# Patient Record
Sex: Female | Born: 1960 | Race: Black or African American | Hispanic: No | Marital: Married | Smoking: Current every day smoker
Health system: Southern US, Community
[De-identification: ages and names within clinical notes are randomized; demographics above are authoritative.]

## PROBLEM LIST (undated history)

## (undated) DIAGNOSIS — G629 Polyneuropathy, unspecified: Secondary | ICD-10-CM

## (undated) DIAGNOSIS — E785 Hyperlipidemia, unspecified: Secondary | ICD-10-CM

## (undated) DIAGNOSIS — I10 Essential (primary) hypertension: Secondary | ICD-10-CM

---

## 1999-11-27 ENCOUNTER — Emergency Department (HOSPITAL_COMMUNITY): Admission: EM | Admit: 1999-11-27 | Discharge: 1999-11-27 | Payer: Self-pay

## 2004-02-28 ENCOUNTER — Emergency Department (HOSPITAL_COMMUNITY): Admission: EM | Admit: 2004-02-28 | Discharge: 2004-02-28 | Payer: Self-pay | Admitting: Emergency Medicine

## 2004-03-03 ENCOUNTER — Encounter: Admission: RE | Admit: 2004-03-03 | Discharge: 2004-03-03 | Payer: Self-pay | Admitting: Specialist

## 2005-10-19 ENCOUNTER — Emergency Department (HOSPITAL_COMMUNITY): Admission: EM | Admit: 2005-10-19 | Discharge: 2005-10-19 | Payer: Self-pay | Admitting: Emergency Medicine

## 2007-02-19 ENCOUNTER — Emergency Department (HOSPITAL_COMMUNITY): Admission: EM | Admit: 2007-02-19 | Discharge: 2007-02-19 | Payer: Self-pay | Admitting: Emergency Medicine

## 2007-02-20 ENCOUNTER — Emergency Department (HOSPITAL_COMMUNITY): Admission: EM | Admit: 2007-02-20 | Discharge: 2007-02-20 | Payer: Self-pay | Admitting: Emergency Medicine

## 2007-08-06 ENCOUNTER — Emergency Department (HOSPITAL_COMMUNITY): Admission: EM | Admit: 2007-08-06 | Discharge: 2007-08-07 | Payer: Self-pay | Admitting: Emergency Medicine

## 2008-08-28 ENCOUNTER — Ambulatory Visit (HOSPITAL_COMMUNITY): Admission: RE | Admit: 2008-08-28 | Discharge: 2008-08-28 | Payer: Self-pay | Admitting: Orthopedic Surgery

## 2008-09-01 ENCOUNTER — Ambulatory Visit: Payer: Self-pay | Admitting: Cardiology

## 2008-09-01 ENCOUNTER — Telehealth (INDEPENDENT_AMBULATORY_CARE_PROVIDER_SITE_OTHER): Payer: Self-pay | Admitting: Radiology

## 2008-09-01 DIAGNOSIS — I10 Essential (primary) hypertension: Secondary | ICD-10-CM | POA: Insufficient documentation

## 2008-09-01 DIAGNOSIS — F17200 Nicotine dependence, unspecified, uncomplicated: Secondary | ICD-10-CM

## 2008-09-01 DIAGNOSIS — R9431 Abnormal electrocardiogram [ECG] [EKG]: Secondary | ICD-10-CM

## 2008-09-02 ENCOUNTER — Encounter: Payer: Self-pay | Admitting: Cardiology

## 2008-11-18 ENCOUNTER — Encounter (INDEPENDENT_AMBULATORY_CARE_PROVIDER_SITE_OTHER): Payer: Self-pay | Admitting: Orthopedic Surgery

## 2008-11-18 ENCOUNTER — Inpatient Hospital Stay (HOSPITAL_COMMUNITY): Admission: RE | Admit: 2008-11-18 | Discharge: 2008-11-23 | Payer: Self-pay | Admitting: Orthopedic Surgery

## 2009-01-06 ENCOUNTER — Inpatient Hospital Stay (HOSPITAL_COMMUNITY): Admission: RE | Admit: 2009-01-06 | Discharge: 2009-01-08 | Payer: Self-pay | Admitting: Orthopedic Surgery

## 2009-04-16 ENCOUNTER — Encounter (INDEPENDENT_AMBULATORY_CARE_PROVIDER_SITE_OTHER): Payer: Self-pay | Admitting: Orthopedic Surgery

## 2009-04-16 ENCOUNTER — Ambulatory Visit: Admission: RE | Admit: 2009-04-16 | Discharge: 2009-04-16 | Payer: Self-pay | Admitting: Orthopedic Surgery

## 2009-04-16 ENCOUNTER — Ambulatory Visit: Payer: Self-pay | Admitting: Vascular Surgery

## 2009-08-10 ENCOUNTER — Emergency Department (HOSPITAL_COMMUNITY): Admission: EM | Admit: 2009-08-10 | Discharge: 2009-08-11 | Payer: Self-pay | Admitting: Emergency Medicine

## 2009-08-14 ENCOUNTER — Emergency Department (HOSPITAL_COMMUNITY): Admission: EM | Admit: 2009-08-14 | Discharge: 2009-08-15 | Payer: Self-pay | Admitting: Emergency Medicine

## 2009-08-31 ENCOUNTER — Emergency Department (HOSPITAL_COMMUNITY): Admission: EM | Admit: 2009-08-31 | Discharge: 2009-08-31 | Payer: Self-pay | Admitting: Emergency Medicine

## 2009-11-18 ENCOUNTER — Emergency Department (HOSPITAL_COMMUNITY): Admission: EM | Admit: 2009-11-18 | Discharge: 2009-11-18 | Payer: Self-pay | Admitting: Emergency Medicine

## 2010-05-21 ENCOUNTER — Emergency Department (HOSPITAL_COMMUNITY)
Admission: EM | Admit: 2010-05-21 | Discharge: 2010-05-22 | Payer: Self-pay | Source: Home / Self Care | Admitting: Emergency Medicine

## 2010-05-22 ENCOUNTER — Encounter: Payer: Self-pay | Admitting: Internal Medicine

## 2010-07-19 LAB — GLUCOSE, CAPILLARY: Glucose-Capillary: 151 mg/dL — ABNORMAL HIGH (ref 70–99)

## 2010-07-19 LAB — DIFFERENTIAL
Basophils Absolute: 0.1 10*3/uL (ref 0.0–0.1)
Basophils Relative: 1 % (ref 0–1)
Eosinophils Absolute: 0 10*3/uL (ref 0.0–0.7)
Eosinophils Relative: 0 % (ref 0–5)
Lymphocytes Relative: 21 % (ref 12–46)
Lymphs Abs: 2 10*3/uL (ref 0.7–4.0)
Monocytes Absolute: 0.3 10*3/uL (ref 0.1–1.0)
Monocytes Relative: 3 % (ref 3–12)
Neutro Abs: 7 10*3/uL (ref 1.7–7.7)
Neutrophils Relative %: 75 % (ref 43–77)

## 2010-07-19 LAB — BASIC METABOLIC PANEL
BUN: 6 mg/dL (ref 6–23)
CO2: 18 mEq/L — ABNORMAL LOW (ref 19–32)
Calcium: 8.8 mg/dL (ref 8.4–10.5)
Chloride: 105 mEq/L (ref 96–112)
Creatinine, Ser: 0.51 mg/dL (ref 0.4–1.2)
GFR calc Af Amer: >60 mL/min (ref >60)
GFR calc non Af Amer: >60 mL/min (ref >60)
Glucose, Bld: 107 mg/dL — ABNORMAL HIGH (ref 70–99)
Potassium: 3.3 mEq/L — ABNORMAL LOW (ref 3.5–5.1)
Sodium: 138 mEq/L (ref 135–145)

## 2010-07-19 LAB — CBC
HCT: 37.5 % (ref 36.0–46.0)
Hemoglobin: 12.4 g/dL (ref 12.0–15.0)
MCHC: 33 g/dL (ref 30.0–36.0)
MCV: 88.1 fL (ref 78.0–100.0)
Platelets: 342 10*3/uL (ref 150–400)
RBC: 4.26 MIL/uL (ref 3.87–5.11)
RDW: 17.6 % — ABNORMAL HIGH (ref 11.5–15.5)
WBC: 9.3 10*3/uL (ref 4.0–10.5)

## 2010-08-05 LAB — COMPREHENSIVE METABOLIC PANEL
ALT: 15 U/L (ref 0–35)
AST: 29 U/L (ref 0–37)
Albumin: 3.7 g/dL (ref 3.5–5.2)
Alkaline Phosphatase: 71 U/L (ref 39–117)
BUN: 9 mg/dL (ref 6–23)
CO2: 23 mEq/L (ref 19–32)
Calcium: 8.8 mg/dL (ref 8.4–10.5)
Chloride: 103 mEq/L (ref 96–112)
Creatinine, Ser: 0.69 mg/dL (ref 0.4–1.2)
GFR calc Af Amer: >60 mL/min (ref >60)
GFR calc non Af Amer: >60 mL/min (ref >60)
Glucose, Bld: 151 mg/dL — ABNORMAL HIGH (ref 70–99)
Potassium: 3.6 mEq/L (ref 3.5–5.1)
Sodium: 136 mEq/L (ref 135–145)
Total Bilirubin: 0.4 mg/dL (ref 0.3–1.2)
Total Protein: 7 g/dL (ref 6.0–8.3)

## 2010-08-05 LAB — CBC
HCT: 34.5 % — ABNORMAL LOW (ref 36.0–46.0)
Hemoglobin: 11.4 g/dL — ABNORMAL LOW (ref 12.0–15.0)
MCHC: 32.9 g/dL (ref 30.0–36.0)
MCV: 88.4 fL (ref 78.0–100.0)
Platelets: 252 10*3/uL (ref 150–400)
RBC: 3.91 MIL/uL (ref 3.87–5.11)
RDW: 16.9 % — ABNORMAL HIGH (ref 11.5–15.5)
WBC: 7.5 10*3/uL (ref 4.0–10.5)

## 2010-08-05 LAB — PROTIME-INR
INR: 0.9 (ref 0.00–1.49)
Prothrombin Time: 12.2 seconds (ref 11.6–15.2)

## 2010-08-05 LAB — APTT: aPTT: 26 seconds (ref 24–37)

## 2010-08-07 LAB — URINE CULTURE: Colony Count: 9000

## 2010-08-07 LAB — URINE MICROSCOPIC-ADD ON

## 2010-08-07 LAB — DIFFERENTIAL
Basophils Absolute: 0 10*3/uL (ref 0.0–0.1)
Basophils Relative: 0 % (ref 0–1)
Eosinophils Absolute: 0.3 10*3/uL (ref 0.0–0.7)
Lymphs Abs: 2.8 10*3/uL (ref 0.7–4.0)
Neutrophils Relative %: 67 % (ref 43–77)

## 2010-08-07 LAB — TYPE AND SCREEN
ABO/RH(D): A POS
Antibody Screen: POSITIVE

## 2010-08-07 LAB — CBC
HCT: 25.1 % — ABNORMAL LOW (ref 36.0–46.0)
HCT: 25.9 % — ABNORMAL LOW (ref 36.0–46.0)
Hemoglobin: 13.3 g/dL (ref 12.0–15.0)
Hemoglobin: 8.5 g/dL — ABNORMAL LOW (ref 12.0–15.0)
Hemoglobin: 8.9 g/dL — ABNORMAL LOW (ref 12.0–15.0)
MCHC: 33.8 g/dL (ref 30.0–36.0)
MCHC: 34.3 g/dL (ref 30.0–36.0)
MCHC: 34.4 g/dL (ref 30.0–36.0)
MCV: 88.9 fL (ref 78.0–100.0)
MCV: 89.2 fL (ref 78.0–100.0)
Platelets: 200 10*3/uL (ref 150–400)
RBC: 2.92 MIL/uL — ABNORMAL LOW (ref 3.87–5.11)
RBC: 4.36 MIL/uL (ref 3.87–5.11)
RDW: 14.9 % (ref 11.5–15.5)
RDW: 15.8 % — ABNORMAL HIGH (ref 11.5–15.5)

## 2010-08-07 LAB — URINALYSIS, ROUTINE W REFLEX MICROSCOPIC
Glucose, UA: NEGATIVE mg/dL
Hgb urine dipstick: NEGATIVE
Ketones, ur: NEGATIVE mg/dL
Nitrite: NEGATIVE
Protein, ur: NEGATIVE mg/dL
Specific Gravity, Urine: 1.018 (ref 1.005–1.030)
Urobilinogen, UA: 0.2 mg/dL (ref 0.0–1.0)
pH: 5.5 (ref 5.0–8.0)

## 2010-08-07 LAB — BASIC METABOLIC PANEL
BUN: 15 mg/dL (ref 6–23)
CO2: 26 mEq/L (ref 19–32)
CO2: 27 mEq/L (ref 19–32)
Calcium: 7.9 mg/dL — ABNORMAL LOW (ref 8.4–10.5)
Calcium: 9.3 mg/dL (ref 8.4–10.5)
Chloride: 98 mEq/L (ref 96–112)
Creatinine, Ser: 1.03 mg/dL (ref 0.4–1.2)
GFR calc Af Amer: >60 mL/min (ref >60)
Glucose, Bld: 127 mg/dL — ABNORMAL HIGH (ref 70–99)
Potassium: 4.3 mEq/L (ref 3.5–5.1)

## 2010-08-07 LAB — COMPREHENSIVE METABOLIC PANEL
ALT: 16 U/L (ref 0–35)
CO2: 25 mEq/L (ref 19–32)
Calcium: 10.2 mg/dL (ref 8.4–10.5)
GFR calc non Af Amer: 51 mL/min — ABNORMAL LOW (ref >60)
Glucose, Bld: 132 mg/dL — ABNORMAL HIGH (ref 70–99)
Sodium: 136 mEq/L (ref 135–145)
Total Bilirubin: 0.7 mg/dL (ref 0.3–1.2)

## 2010-08-07 LAB — PROTIME-INR
INR: 1 (ref 0.00–1.49)
INR: 1 (ref 0.00–1.49)
INR: 1.9 — ABNORMAL HIGH (ref 0.00–1.49)
INR: 2.6 — ABNORMAL HIGH (ref 0.00–1.49)
INR: 2.8 — ABNORMAL HIGH (ref 0.00–1.49)
Prothrombin Time: 12.9 seconds (ref 11.6–15.2)
Prothrombin Time: 13.9 seconds (ref 11.6–15.2)
Prothrombin Time: 31.3 seconds — ABNORMAL HIGH (ref 11.6–15.2)

## 2010-08-10 LAB — BASIC METABOLIC PANEL
Calcium: 10 mg/dL (ref 8.4–10.5)
Chloride: 98 mEq/L (ref 96–112)
Creatinine, Ser: 0.81 mg/dL (ref 0.4–1.2)
GFR calc Af Amer: >60 mL/min (ref >60)

## 2010-08-10 LAB — DIFFERENTIAL
Lymphocytes Relative: 23 % (ref 12–46)
Lymphs Abs: 2.4 10*3/uL (ref 0.7–4.0)
Monocytes Relative: 3 % (ref 3–12)
Neutro Abs: 7.4 10*3/uL (ref 1.7–7.7)
Neutrophils Relative %: 71 % (ref 43–77)

## 2010-08-10 LAB — URINALYSIS, ROUTINE W REFLEX MICROSCOPIC
Glucose, UA: NEGATIVE mg/dL
Ketones, ur: NEGATIVE mg/dL
pH: 5.5 (ref 5.0–8.0)

## 2010-08-10 LAB — TYPE AND SCREEN: ABO/RH(D): A POS

## 2010-08-10 LAB — URINE MICROSCOPIC-ADD ON

## 2010-08-10 LAB — PROTIME-INR: INR: 0.9 (ref 0.00–1.49)

## 2010-08-10 LAB — URINE CULTURE

## 2010-08-10 LAB — CBC
RBC: 4.92 MIL/uL (ref 3.87–5.11)
WBC: 10.5 10*3/uL (ref 4.0–10.5)

## 2010-09-13 NOTE — Discharge Summary (Signed)
NAME:  Lynn Mayer, Lynn Mayer             ACCOUNT NO.:  [id number]   MEDICAL RECORD NO.:  [id number]          PATIENT TYPE:  INP   LOCATION:  5041                         FACILITY:  [facility]   PHYSICIAN:  Burnard Bunting, M.D.    DATE OF BIRTH:  1961/04/28   DATE OF ADMISSION:  11/18/2008  DATE OF DISCHARGE:  11/23/2008                               DISCHARGE SUMMARY   ADMISSION DIAGNOSES:  1. Left hip arthritis.  2. Hypertension.   DISCHARGE DIAGNOSES:  1. Left hip arthritis.  2. Hypertension.  3. Posthemorrhagic anemia.   PROCEDURE:  On November 18, 2008, the patient underwent left total hip  arthroplasty performed by Dr. August Saucer, assisted by Maud Deed, [facility],  under general anesthesia.   CONSULTATIONS:  None.   BRIEF HISTORY:  Auden is a 50 year old female with left hip arthritis.  She has failed conservative treatment and wished to proceed with  surgical intervention and was admitted for a left total hip replacement.   BRIEF HOSPITAL COURSE:  The patient tolerated the procedure under  general anesthesia without complications.  Postoperatively, pain was  controlled with PCA analgesics.  She was gradually weaned to p.o.  analgesics without difficulty.  Neurovascular motor function of the  lower extremities was intact throughout the hospital stay.  Dressing  changes were done daily and her wound was healing without erythema or  drainage.  She was started on physical therapy.  She was able to  ambulate greater than 400 feet while in the hospital.  Initially, she  had asked for nursing home placement as she did not have an adult  staying with her postoperatively.  She was able to progress quite well  and was independent prior to discharge and therefore did not need a  skilled nursing facility.  She was started on Coumadin for DVT  prophylaxis.  Adjustments in Coumadin dose were made according to daily  protimes by the pharmacist.  The patient was voiding without difficulty  after her Foley  catheter was discontinued.  She was afebrile and vital  signs were stable at the time of discharge.   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  Hemoglobin and hematocrit on admission 13.3 and 38.8 respectively.  Hemoglobin and hematocrit dropped to lowest value of 8.5 and 25.1.  Chemistry studies on admission within normal limits.  INR at discharge  2.6.  Chemistry studies normal with the exception of mild hyponatremia  at 133.  Hypocalcemia at 7.9.  Urinalysis on admission with moderate  leukocyte esterase, many epithelial cells, 21-50 wbc's per high-power  field, and many bacteria.  Urine culture showed insignificant growth.   PLAN:  The patient was discharged to her home.  Arrangements were made  through [facility] to provide home health physical therapy  and durable medical equipment.  The patient is instructed to keep her  incision dry and clean at all times.  She will be allowed to shower  after her return office visit with Dr. August Saucer in 1  week.  She is  instructed to continue ambulating as tolerated.  She will follow total  hip replacement precautions.  She will keep her incision dry and clean  and will change the dressing as needed.   DISCHARGE MEDICATIONS:  1. Norco for pain.  2. Robaxin for spasm.  3. Coumadin 5 mg 1 tablet daily for 4 weeks.   The patient will resume home medications of amlodipine and clonidine.  However, she will discontinue propoxyphene and Naprelan.  She will also  continue by Bystolic and Benicar.   The patient is instructed to call the office should she have questions  or concerns prior to return office visit.   CONDITION ON DISCHARGE:  Stable.      Wende Neighbors, P.A.      Burnard Bunting, M.D.  Electronically Signed    SMV/MEDQ  D:  12/17/2008  T:  12/18/2008  Job:   [id number]

## 2010-09-13 NOTE — Op Note (Signed)
NAME:  Lynn Mayer, Lynn Mayer             ACCOUNT NO.:  [id number]   MEDICAL RECORD NO.:  [id number]          PATIENT TYPE:  INP   LOCATION:  5041                         FACILITY:  [facility]   PHYSICIAN:  Burnard Bunting, M.D.    DATE OF BIRTH:  [birth year]   DATE OF PROCEDURE:  11/18/2008  DATE OF DISCHARGE:                               OPERATIVE REPORT   PREOPERATIVE DIAGNOSIS:  Left hip arthritis.   POSTOPERATIVE DIAGNOSIS:  Left hip arthritis.   PROCEDURE:  Left total hip replacement.   SURGEON:  Burnard Bunting, MD   ASSISTANT:  Wende Neighbors, PA   ANESTHESIA:  General.   INDICATIONS:  Lynn Mayer is a 50 year old female with left hip  arthritis who presents for total hip replacement after explanation of  risks and benefits of the procedure.   OPERATIVE FINDINGS:  1. Hip arthritis.  2. Rheumatoid-like synovitis with very proliferative synovitis within      the joint with a large cyst in the pericapsular area filled with      gelatinous cyst-like fluid.  The samples of the synovium were sent      for analysis to pathology.   ESTIMATED BLOOD LOSS:  300.   COMPONENTS:  DePuy acetabular cup, 52 mm pinnacle metal insert, 36 x 52  SROM proximal sleeve 11F small metal-on-metal femoral head +9, 36 with  11x 13 taper, SROM femoral stem, 36 standard neck +8 lateral 18 x 13.   PROCEDURE IN DETAIL:  The patient was brought to the operating room  where general endotracheal anesthesia was induced and preoperative  antibiotics were administered.  Left hip was prepped with DuraPrep  solution.  After that, the patient was placed in lateral decubitus  position with right axilla and right peroneal nerve well-padded.  Left  leg, hip, and foot were then prepped with DuraPrep solution and draped  in a sterile manner.  The Lynn Mayer was used to scrub the operative field.  Posterior approach to the hip was utilized.  Skin and subcutaneous  tissues were sharply divided.  Fascia lata was encountered  divided  sharply.  Gluteus maximus muscle was split in direction of its fibers.  Bleeding points were carefully controlled using electrocautery.  The  sciatic nerve was palpated and protected all times during the case.  The  patient had exuberant synovitis noted as well as cystic structure  filling the piriformis region.  This cyst was decompressed.  It was near  the sciatic nerve.  The cyst was decompressed and removed with care  taken to avoid injury to the sciatic nerve.  Synovitis was sent for  analysis.  This appeared to be like rheumatoid-type synovitis.  The  following removal of the cyst and removal of the synovitis, the femoral  neck was cut in perpendicular to mechanical axis in accordance with  preoperative templating and intraoperative templating.  The canal was  then reamed up to 13.5 mm with a good chatter and press-fit obtained.  At this time, the acetabulum was prepared.  The labrum was removed.  The  socket was reamed in approximately 45 degrees of abduction  and 10-15  degrees of anteversion.  The cup was then placed once good bleeding bone  was encountered.  The inner table was not penetrated.  At this time,  femur was then broached and sleeve was placed.  Trial stem was placed  first in 20 and then in 10 degrees of anteversion with a +0, +3, +6, and  +9 neck.  Radiographs made with the +3 demonstrated approximately equal  leg lengths good position of the cup and good fill of the stem in the  canal.  Trial components were removed from the femur.  The true  acetabular cup was placed and tapped in good position.  Metal liner was  placed.  The patient had the sleeve and stem was placed.  The patient  had slight anterior subluxation, which was corrected by going from +3 to  +9.  In full extension and external rotation, the patient's hip was  stable.  The position of sleeve as well as 90 degrees of hip flexion, 10  degrees of adduction, and 70 degrees of internal rotation.  At  this  time, the true +9 head was placed.  Incision was thoroughly irrigated.  The sciatic nerve was again palpated and found to be intact.  Capsule  was then closed.  It was split in T-shaped fashion and marked with  sutures earlier.  Following marking with the sutures, dislocation in the  case was performed as described.  After thorough irrigation, the capsule  was reapproximated with piriformis tendon suture to the capsule.  This  was done with #1 Vicryl suture.  Hemovac drain was placed.  Incision was  then closed using interrupted inverted #1 Vicryl suture to reapproximate  the fascia lata followed by 0 Vicryl suture, 2-0 Vicryl suture and skin  staples.  The patient tolerated the procedure well without immediate  complication.  Leg lengths were approximately equal at the conclusion of  the case.  The knee immobilizer was placed.  The patient tolerated the  procedure well without immediate complication.  Lynn Mayer's  assistance was required all times during the case for retraction  purposes as well as limb positioning purposes.  This was required both  for preparation of the femur and the acetabulum as well as for the  incision opening as well as the incision closing.  Her assistance was a  medical necessity.      Burnard Bunting, M.D.  Electronically Signed     GSD/MEDQ  D:  11/18/2008  T:  11/19/2008  Job:  [id number]

## 2011-07-17 ENCOUNTER — Other Ambulatory Visit: Payer: Self-pay

## 2011-07-17 ENCOUNTER — Encounter (HOSPITAL_COMMUNITY): Payer: Self-pay | Admitting: Anesthesiology

## 2011-07-17 ENCOUNTER — Encounter (HOSPITAL_COMMUNITY): Payer: Self-pay | Admitting: Emergency Medicine

## 2011-07-17 ENCOUNTER — Inpatient Hospital Stay (HOSPITAL_COMMUNITY)
Admission: EM | Admit: 2011-07-17 | Discharge: 2011-07-21 | DRG: 582 | Disposition: A | Discharge status: Home / Self Care | Payer: BC Managed Care – PPO | Source: Ambulatory Visit | Attending: Pulmonary Disease | Admitting: Pulmonary Disease

## 2011-07-17 ENCOUNTER — Emergency Department (HOSPITAL_COMMUNITY): Payer: BC Managed Care – PPO | Admitting: Anesthesiology

## 2011-07-17 ENCOUNTER — Emergency Department (HOSPITAL_COMMUNITY): Payer: BC Managed Care – PPO

## 2011-07-17 DIAGNOSIS — E119 Type 2 diabetes mellitus without complications: Secondary | ICD-10-CM | POA: Diagnosis present

## 2011-07-17 DIAGNOSIS — T46905A Adverse effect of unspecified agents primarily affecting the cardiovascular system, initial encounter: Secondary | ICD-10-CM | POA: Diagnosis present

## 2011-07-17 DIAGNOSIS — R9431 Abnormal electrocardiogram [ECG] [EKG]: Secondary | ICD-10-CM

## 2011-07-17 DIAGNOSIS — G609 Hereditary and idiopathic neuropathy, unspecified: Secondary | ICD-10-CM | POA: Diagnosis present

## 2011-07-17 DIAGNOSIS — T783XXA Angioneurotic edema, initial encounter: Principal | ICD-10-CM

## 2011-07-17 DIAGNOSIS — I10 Essential (primary) hypertension: Secondary | ICD-10-CM

## 2011-07-17 DIAGNOSIS — F17200 Nicotine dependence, unspecified, uncomplicated: Secondary | ICD-10-CM

## 2011-07-17 DIAGNOSIS — J9600 Acute respiratory failure, unspecified whether with hypoxia or hypercapnia: Secondary | ICD-10-CM

## 2011-07-17 DIAGNOSIS — E785 Hyperlipidemia, unspecified: Secondary | ICD-10-CM | POA: Diagnosis present

## 2011-07-17 HISTORY — DX: Essential (primary) hypertension: I10

## 2011-07-17 HISTORY — DX: Hyperlipidemia, unspecified: E78.5

## 2011-07-17 HISTORY — DX: Polyneuropathy, unspecified: G62.9

## 2011-07-17 LAB — COMPREHENSIVE METABOLIC PANEL
Albumin: 3.3 g/dL — ABNORMAL LOW (ref 3.5–5.2)
Alkaline Phosphatase: 91 U/L (ref 39–117)
BUN: 13 mg/dL (ref 6–23)
Calcium: 8.7 mg/dL (ref 8.4–10.5)
GFR calc Af Amer: >90 mL/min (ref >90)
Glucose, Bld: 196 mg/dL — ABNORMAL HIGH (ref 70–99)
Potassium: 3.5 mEq/L (ref 3.5–5.1)
Total Protein: 6.8 g/dL (ref 6.0–8.3)

## 2011-07-17 LAB — URINALYSIS, ROUTINE W REFLEX MICROSCOPIC
Bilirubin Urine: NEGATIVE
Glucose, UA: NEGATIVE mg/dL
Hgb urine dipstick: NEGATIVE
Protein, ur: 100 mg/dL — AB
Urobilinogen, UA: 0.2 mg/dL (ref 0.0–1.0)

## 2011-07-17 LAB — LACTIC ACID, PLASMA: Lactic Acid, Venous: 3.1 mmol/L — ABNORMAL HIGH (ref 0.5–2.2)

## 2011-07-17 LAB — CARDIAC PANEL(CRET KIN+CKTOT+MB+TROPI)
CK, MB: 2.4 ng/mL (ref 0.3–4.0)
CK, MB: 3.1 ng/mL (ref 0.3–4.0)
Total CK: 79 U/L (ref 7–177)
Troponin I: <0.3 ng/mL (ref <0.30)

## 2011-07-17 LAB — URINE MICROSCOPIC-ADD ON

## 2011-07-17 LAB — PHOSPHORUS: Phosphorus: 3.8 mg/dL (ref 2.3–4.6)

## 2011-07-17 LAB — POCT I-STAT 3, ART BLOOD GAS (G3+)
Bicarbonate: 19.5 mEq/L — ABNORMAL LOW (ref 20.0–24.0)
O2 Saturation: 100 %
TCO2: 21 mmol/L (ref 0–100)
pCO2 arterial: 35.5 mmHg (ref 35.0–45.0)
pH, Arterial: 7.334 — ABNORMAL LOW (ref 7.350–7.400)
pO2, Arterial: 424 mmHg — ABNORMAL HIGH (ref 80.0–100.0)

## 2011-07-17 LAB — PRO B NATRIURETIC PEPTIDE: Pro B Natriuretic peptide (BNP): 90.2 pg/mL (ref 0–125)

## 2011-07-17 LAB — GLUCOSE, CAPILLARY
Glucose-Capillary: 197 mg/dL — ABNORMAL HIGH (ref 70–99)
Glucose-Capillary: 164 mg/dL — ABNORMAL HIGH (ref 70–99)

## 2011-07-17 LAB — PROTIME-INR
INR: 0.93 (ref 0.00–1.49)
Prothrombin Time: 12.7 seconds (ref 11.6–15.2)

## 2011-07-17 LAB — MAGNESIUM: Magnesium: 1.3 mg/dL — ABNORMAL LOW (ref 1.5–2.5)

## 2011-07-17 LAB — CBC
Hemoglobin: 10.6 g/dL — ABNORMAL LOW (ref 12.0–15.0)
MCH: 27.6 pg (ref 26.0–34.0)
MCHC: 32.5 g/dL (ref 30.0–36.0)
RDW: 15.3 % (ref 11.5–15.5)

## 2011-07-17 MED ORDER — MIDAZOLAM HCL 2 MG/2ML IJ SOLN
2.0000 mg | INTRAMUSCULAR | Status: DC | PRN
  Administered 2011-07-17 – 2011-07-18 (×4): 2 mg via INTRAVENOUS

## 2011-07-17 MED ORDER — SUCCINYLCHOLINE CHLORIDE 20 MG/ML IJ SOLN
INTRAMUSCULAR | Status: AC
  Filled 2011-07-17: qty 10

## 2011-07-17 MED ORDER — ETOMIDATE 2 MG/ML IV SOLN
INTRAVENOUS | Status: AC
  Filled 2011-07-17: qty 20

## 2011-07-17 MED ORDER — DIPHENHYDRAMINE HCL 50 MG/ML IJ SOLN
INTRAMUSCULAR | Status: AC
  Administered 2011-07-17: 50 mg via INTRAVENOUS
  Filled 2011-07-17: qty 1

## 2011-07-17 MED ORDER — FAMOTIDINE IN NACL 20-0.9 MG/50ML-% IV SOLN
INTRAVENOUS | Status: AC
  Filled 2011-07-17: qty 50

## 2011-07-17 MED ORDER — FENTANYL BOLUS VIA INFUSION
50.0000 ug | Freq: Four times a day (QID) | INTRAVENOUS | Status: DC | PRN
  Filled 2011-07-17: qty 100

## 2011-07-17 MED ORDER — PANTOPRAZOLE SODIUM 40 MG IV SOLR: 40.0000 mg | Freq: Every day | INTRAVENOUS | Status: DC

## 2011-07-17 MED ORDER — MIDAZOLAM HCL 5 MG/5ML IJ SOLN
INTRAMUSCULAR | Status: DC | PRN
  Administered 2011-07-17 (×2): .5 mg via INTRAVENOUS
  Administered 2011-07-17: 1 mg via INTRAVENOUS

## 2011-07-17 MED ORDER — LIDOCAINE HCL (CARDIAC) 20 MG/ML IV SOLN
INTRAVENOUS | Status: AC
  Filled 2011-07-17: qty 5

## 2011-07-17 MED ORDER — LIDOCAINE HCL 4 % IJ SOLN
INTRAMUSCULAR | Status: DC | PRN
  Administered 2011-07-17: 2 mL

## 2011-07-17 MED ORDER — OXYMETAZOLINE HCL 0.05 % NA SOLN
NASAL | Status: AC
  Filled 2011-07-17: qty 15

## 2011-07-17 MED ORDER — METHYLPREDNISOLONE SODIUM SUCC 125 MG IJ SOLR
125.0000 mg | Freq: Once | INTRAMUSCULAR | Status: AC
  Administered 2011-07-17: 06:00:00 via INTRAVENOUS

## 2011-07-17 MED ORDER — DIPHENHYDRAMINE HCL 50 MG/ML IJ SOLN: 25.0000 mg | Freq: Two times a day (BID) | INTRAMUSCULAR | Status: DC

## 2011-07-17 MED ORDER — SODIUM CHLORIDE 0.9 % IV SOLN
INTRAVENOUS | Status: DC
  Administered 2011-07-17 – 2011-07-19 (×2): via INTRAVENOUS

## 2011-07-17 MED ORDER — BUTAMBEN-TETRACAINE-BENZOCAINE 2-2-14 % EX AERO
INHALATION_SPRAY | CUTANEOUS | Status: DC | PRN
  Administered 2011-07-17: 2 via TOPICAL

## 2011-07-17 MED ORDER — SODIUM CHLORIDE 0.9 % IJ SOLN
INTRAMUSCULAR | Status: AC
  Filled 2011-07-17: qty 10

## 2011-07-17 MED ORDER — METHYLPREDNISOLONE SODIUM SUCC 125 MG IJ SOLR
INTRAMUSCULAR | Status: AC
  Filled 2011-07-17: qty 2

## 2011-07-17 MED ORDER — PROPOFOL 10 MG/ML IV BOLUS
INTRAVENOUS | Status: DC | PRN
  Administered 2011-07-17: 200 mg via INTRAVENOUS

## 2011-07-17 MED ORDER — MAGNESIUM SULFATE 50 % IJ SOLN
6.0000 g | Freq: Once | INTRAVENOUS | Status: AC
  Administered 2011-07-17: 6 g via INTRAVENOUS
  Filled 2011-07-17 (×3): qty 12

## 2011-07-17 MED ORDER — FENTANYL CITRATE 0.05 MG/ML IJ SOLN
INTRAMUSCULAR | Status: AC
  Filled 2011-07-17: qty 4

## 2011-07-17 MED ORDER — DIPHENHYDRAMINE HCL 50 MG/ML IJ SOLN
50.0000 mg | Freq: Four times a day (QID) | INTRAMUSCULAR | Status: AC
  Administered 2011-07-17 – 2011-07-18 (×4): 50 mg via INTRAVENOUS
  Filled 2011-07-17 (×4): qty 1

## 2011-07-17 MED ORDER — FENTANYL CITRATE 0.05 MG/ML IJ SOLN
INTRAMUSCULAR | Status: DC | PRN
  Administered 2011-07-17: 200 ug via INTRAVENOUS
  Administered 2011-07-17: 50 ug via INTRAVENOUS

## 2011-07-17 MED ORDER — ROCURONIUM BROMIDE 50 MG/5ML IV SOLN
INTRAVENOUS | Status: AC
  Filled 2011-07-17: qty 2

## 2011-07-17 MED ORDER — METHYLPREDNISOLONE SODIUM SUCC 125 MG IJ SOLR
80.0000 mg | Freq: Four times a day (QID) | INTRAMUSCULAR | Status: DC
  Administered 2011-07-17 (×2): 80 mg via INTRAVENOUS
  Administered 2011-07-17: 125 mg via INTRAVENOUS
  Administered 2011-07-18 – 2011-07-19 (×6): 80 mg via INTRAVENOUS
  Filled 2011-07-17: qty 1.28
  Filled 2011-07-17 (×2): qty 2
  Filled 2011-07-17 (×3): qty 1.28
  Filled 2011-07-17: qty 2
  Filled 2011-07-17 (×2): qty 1.28
  Filled 2011-07-17 (×2): qty 2
  Filled 2011-07-17 (×3): qty 1.28

## 2011-07-17 MED ORDER — FENTANYL CITRATE 0.05 MG/ML IJ SOLN
25.0000 ug | Freq: Once | INTRAMUSCULAR | Status: AC
  Administered 2011-07-17: 25 ug via INTRAVENOUS

## 2011-07-17 MED ORDER — INSULIN ASPART 100 UNIT/ML ~~LOC~~ SOLN
0.0000 [IU] | SUBCUTANEOUS | Status: DC
  Administered 2011-07-17 – 2011-07-19 (×12): 2 [IU] via SUBCUTANEOUS
  Administered 2011-07-19: 3 [IU] via SUBCUTANEOUS

## 2011-07-17 MED ORDER — FAMOTIDINE IN NACL 20-0.9 MG/50ML-% IV SOLN
20.0000 mg | Freq: Once | INTRAVENOUS | Status: AC
  Administered 2011-07-17: 20 mg via INTRAVENOUS

## 2011-07-17 MED ORDER — SODIUM CHLORIDE 0.9 % IJ SOLN
INTRAMUSCULAR | Status: AC
  Administered 2011-07-17: 19:00:00
  Filled 2011-07-17: qty 10

## 2011-07-17 MED ORDER — SODIUM CHLORIDE 0.9 % IV SOLN
50.0000 ug/h | INTRAVENOUS | Status: DC
  Administered 2011-07-17: 50 ug/h via INTRAVENOUS
  Filled 2011-07-17 (×2): qty 50

## 2011-07-17 MED ORDER — MIDAZOLAM HCL 2 MG/2ML IJ SOLN
2.0000 mg | Freq: Once | INTRAMUSCULAR | Status: DC
  Filled 2011-07-17 (×4): qty 2

## 2011-07-17 MED ORDER — EPINEPHRINE 0.3 MG/0.3ML IJ DEVI
0.3000 mg | Freq: Once | INTRAMUSCULAR | Status: AC
  Administered 2011-07-17: 0.3 mg via INTRAMUSCULAR
  Filled 2011-07-17: qty 0.3

## 2011-07-17 MED ORDER — FAMOTIDINE IN NACL 20-0.9 MG/50ML-% IV SOLN
20.0000 mg | Freq: Two times a day (BID) | INTRAVENOUS | Status: DC
  Administered 2011-07-17 – 2011-07-19 (×5): 20 mg via INTRAVENOUS
  Filled 2011-07-17 (×7): qty 50

## 2011-07-17 MED ORDER — PROPOFOL 10 MG/ML IV EMUL
5.0000 ug/kg/min | Freq: Once | INTRAVENOUS | Status: DC
  Filled 2011-07-17: qty 100

## 2011-07-17 MED ORDER — MIDAZOLAM HCL 2 MG/2ML IJ SOLN
INTRAMUSCULAR | Status: AC
  Filled 2011-07-17: qty 4

## 2011-07-17 MED ORDER — FENTANYL CITRATE 0.05 MG/ML IJ SOLN
50.0000 ug | INTRAMUSCULAR | Status: DC | PRN
  Filled 2011-07-17: qty 2

## 2011-07-17 NOTE — ED Notes (Signed)
Report to day shift, on full vent support

## 2011-07-17 NOTE — ED Notes (Signed)
Pt alert, NAD, calm, spitting in to bag, also allowing suctioning of mouth, cooperative, follows commands, speech alterred, anesthesia MD and CRNA and RT at [facility], EDP at [facility], husband present.

## 2011-07-17 NOTE — ED Notes (Signed)
Report called to 2100 fentanyl drip started at or 5 cc

## 2011-07-17 NOTE — Anesthesia Preprocedure Evaluation (Signed)
Anesthesia Evaluation  Patient identified by MRN, date of birth, ID band Patient awake    Reviewed: Allergy & Precautions, H&P , NPO status   History of Anesthesia Complications Negative for: history of anesthetic complications  Airway Mallampati: IV TM Distance: >3 FB Neck ROM: Limited  Mouth opening: Limited Mouth Opening  Dental  (+) Teeth Intact and Dental Advisory Given   Pulmonary  Patient's tongue, lips and neck extremely swollen and patient having trouble clearing secretions breath sounds clear to auscultation  Pulmonary exam normal       Cardiovascular hypertension, Rhythm:Regular Rate:Normal     Neuro/Psych    GI/Hepatic   Endo/Other    Renal/GU      Musculoskeletal   Abdominal (+) + obese,   Peds  Hematology   Anesthesia Other Findings   Reproductive/Obstetrics                           Anesthesia Physical Anesthesia Plan  ASA: III and Emergent  Anesthesia Plan:    Post-op Pain Management:    Induction:   Airway Management Planned: Oral ETT, Video Laryngoscope Planned, Fiberoptic Intubation Planned and Awake Intubation Planned  Additional Equipment:   Intra-op Plan:   Post-operative Plan:   Informed Consent: I have reviewed the patients History and Physical, chart, labs and discussed the procedure including the risks, benefits and alternatives for the proposed anesthesia with the patient or authorized representative who has indicated his/her understanding and acceptance.   Dental advisory given  Plan Discussed with: CRNA and Surgeon  Anesthesia Plan Comments: (Emergent awake airway management)        Anesthesia Quick Evaluation

## 2011-07-17 NOTE — ED Notes (Signed)
Pt received from night shift pt intubated per anesthesia chest raxy done for placement pt suctioned per resp for blood tinged sputum ,abgs drawn per resp , temp foley placed # 16 draining clear yellow urine. Pt opens eyes to voice and is aware of  ET tube. Explained that tube was there for airway protection. Pt nods understanding. Pt on cardiac monitor pulse ox and bp

## 2011-07-17 NOTE — ED Notes (Signed)
CCRNA cont to attempt intubation,

## 2011-07-17 NOTE — ED Provider Notes (Signed)
Pt seen with PA She is here for tongue swelling She is having difficulty speaking and difficulty handling secretions She is currently at normal oxygenation I have spoken to anesthesia (dr Jean Rosenthal) and they will evaluate patient for intubation Epinephrine was given  Joya Gaskins, MD 07/17/11 [phone]

## 2011-07-17 NOTE — ED Notes (Addendum)
 fentanyl, 2mg  versed, of diprivan.

## 2011-07-17 NOTE — Anesthesia Procedure Notes (Signed)
Procedure Name: Intubation and Awake intubation Date/Time: 07/17/2011 7:08 AM Performed by: Erling Cruz Leeandra Ellerson Pre-anesthesia Checklist: Patient identified, Emergency Drugs available, Suction available, Patient being monitored and Timeout performed Patient Re-evaluated:Patient Re-evaluated prior to inductionTube type: Oral Tube size: 6.5 mm Number of attempts: 3 Airway Equipment and Method: Video-laryngoscopy and Fiberoptic brochoscope Placement Confirmation: ETT inserted through vocal cords under direct vision,  positive ETCO2,  CO2 detector and breath sounds checked- equal and bilateral Secured at: 22 cm Difficulty Due To: Difficulty was anticipated, Difficult Airway- due to large tongue, Difficult Airway- due to reduced neck mobility, Difficult Airway-  due to edematous airway and Difficult Airway- due to limited oral opening Comments: Patient identified with extremely edematous airway.  Airway topicalized with oral cetacaine spray to post oropharynx, as well as 4% lidocaine to lat tongue bases tonsillar fauces.  VideoGlide scope inserted,patient unable to tolerate.  Oral ETT positioned on tongue and Fiberoptic scope used to visualized glottis and insert ETT.  Difficult to visualize, patient uncomfortable but cooperative.  She was reassured throughout and understood gravity.  False cords and epiglottis very edematous, carina visualized, ETT passed.  BBS=, +ETCO2 and patient sedated.  VSS throughout  Sandford Craze, MD

## 2011-07-17 NOTE — ED Notes (Signed)
Bagging and preparing to intubate.

## 2011-07-17 NOTE — ED Notes (Signed)
Pt intubated,  diprivan given for sedation.

## 2011-07-17 NOTE — ED Notes (Signed)
Patient with tongue swelling, patient having a hard time swallowing saliva.  Patient is CAOx3, tongue is taking up most of mouth cavity, muffled speech.  Patient states that it woke her from sleep at 0430.  Patient is now spitting into container for her saliva, blood tinged saliva.  Patient is on Lisinopril, has been on for 2 years.  Patient being moved to [facility] 4 for intubation.

## 2011-07-17 NOTE — ED Notes (Addendum)
Patient with tongue and lip, jaw angioedema.  Woke from sleep around 0430 and progressively getting worse.  Patient is on lisinopril.  Patient is CAOx3.  Patient having hard time swallowing.  Patient was given 50mg  Benadryl IV by EMS.

## 2011-07-17 NOTE — ED Notes (Signed)
16 Fr. Foley inserted with no resistance.  Small amount of clear urine returned

## 2011-07-17 NOTE — H&P (Signed)
Name: Lynn Mayer MRN: [id number] DOB: Sep 28, 1960    LOS: 0 Requesting MD: Bebe Shaggy EDP  PCCM ADMIT NOTE  History of Present Illness: 51 yo AAF who went to bed 3/17 with no issue. Awoke the am of 3/18 with tongue swelling and airway compromise. Transported to [facility] ED  And urgently intubated by anesthesia with #6 OTT via scope. PCCM asked to admit.  Lines / Drains: 3/18 ott>>  Cultures: none  Antibiotics: none  Tests / Events: 3/18- difficult intubation  Subjective: Awake  and alert. Follows commands prior to intubation. Past Medical History  Diagnosis Date  . Hypertension   . Diabetes mellitus   . Hyperlipidemia   . Peripheral neuropathy    No past surgical history on file. Prior to Admission medications   Not on File   Allergies No Known Allergies  Family History No family history on file.  Social History  does not have a smoking history on file. She does not have any smokeless tobacco history on file. Her alcohol and drug histories not on file.  Review Of Systems  na  Vital Signs: Temp:  [99.1 F (37.3 C)] 99.1 F (37.3 C) (03/18 0615) Pulse Rate:  [95] 95  (03/18 0615) Resp:  [19-26] 26  (03/18 0633) BP: (98-151)/(51-84) 98/51 mmHg (03/18 0714) SpO2:  [95 %-100 %] 100 % (03/18 0714)    Physical Examination: General:  wnwd aaf, INTUBATED AND SEDATED rass 0 Neuro: intact  HEENT:  Angioedema. Bloody sputum Cardiovascular:  hsr rrr Lungs:  cta Abdomen:  Obese + bs Musculoskeletal:  intact Skin:  Lt elbow with large growth  Ventilator settings:    Labs and Imaging:  No results found. No results found for this basename: NA:3,K:3,CL:3,CO2:3,BUN:3,CREATININE:3,GLUCOSE:3 in the last 168 hours No results found for this basename: HGB:3,HCT:3,WBC:3,PLT:3 in the last 168 hours ABG No results found for this basename: phart, pco2, pco2art, po2, po2art, hco3, tco2, acidbasedef, o2sat    Assessment and Plan: Acute resp failure secondary to  angioedema ? Trigger ACEI (need home med list) -vent to 8 cc/kg, rate 12, 100% peep 5 , abg to follow -steroids -H2 blockers bendaryl x 4 doses -sedation needed to rass -2, avoid self extubation -hold all antihypertensives -pcxr reviewed, advance ett  Aggitation Fent,  And int versed ordered, if fail, consider propofol wua in am    HTN -hold all antihypertensives -after confirm ACEI, then add as allergy   DM -ssi addition, esepcaiily dm with steroids   Best practices / Disposition: -->ICU status under PCCM -->full code -->pas for DVT Px -->Pepcid  for GI Px -->ventilator bundle -->diet npo -->family updated at bedside  Ccm time 40 min  Mcarthur Rossetti. Tyson Alias, MD, FACP Pgr: [phone] [facility]

## 2011-07-17 NOTE — ED Provider Notes (Signed)
History     CSN: [id number]  Arrival date & time 07/17/11  0609   First MD Initiated Contact with Patient 07/17/11 [phone]      Chief Complaint  Patient presents with  . Angioedema    (Consider location/radiation/quality/duration/timing/severity/associated sxs/prior treatment) HPI Comments: The patient reports she woke up with swelling in her mouth and throat.  States that this woke her from sleep.  States that she is having difficulty swallowing and breathing.  Patient denies any known allergies, states she does take antihypertensive medication lisinopril.    The history is provided by the patient. The history is limited by the condition of the patient.  course is worsening Nothing improved symptoms  Past Medical History  Diagnosis Date  . Hypertension   . Diabetes mellitus   . Hyperlipidemia   . Peripheral neuropathy     No past surgical history on file.  No family history on file.  History  Substance Use Topics  . Smoking status: Not on file  . Smokeless tobacco: Not on file  . Alcohol Use:     OB History    Grav Para Term Preterm Abortions TAB SAB Ect Mult Living                  Review of Systems  Unable to perform ROS   Allergies  Review of patient's allergies indicates no known allergies.  Home Medications  No current outpatient prescriptions on file.  BP 126/83  Pulse 95  Temp(Src) 99.1 F (37.3 C) (Oral)  Resp 19  SpO2 95%  Physical Exam  Nursing note and vitals reviewed. Constitutional: She is oriented to person, place, and time. She appears well-developed and well-nourished.  HENT:  Head: Normocephalic and atraumatic.       Edema of tongue, unable to visualize pharynx.   Eyes: EOM are normal. Right eye exhibits no discharge. Left eye exhibits no discharge. No scleral icterus.  Neck: Tracheal tenderness present. Edema present. No tracheal deviation present.       Tender diffuse enlargement of anterior neck.    Cardiovascular: Regular  rhythm.   Pulmonary/Chest: Breath sounds normal. No stridor. Tachypnea noted. She has no decreased breath sounds. She has no wheezes. She has no rales.  Musculoskeletal: Normal range of motion.  Neurological: She is alert and oriented to person, place, and time. Coordination normal.  Skin: No rash noted. She is not diaphoretic.  Psychiatric: Her behavior is normal. Her mood appears anxious.    ED Course  Procedures (including critical care time)  Labs Reviewed  COMPREHENSIVE METABOLIC PANEL - Abnormal; Notable for the following:    CO2 18 (*)    Glucose, Bld 196 (*)    Albumin 3.3 (*)    All other components within normal limits  MAGNESIUM - Abnormal; Notable for the following:    Magnesium 1.3 (*)    All other components within normal limits  LACTIC ACID, PLASMA - Abnormal; Notable for the following:    Lactic Acid, Venous 3.1 (*)    All other components within normal limits  CBC - Abnormal; Notable for the following:    WBC 18.4 (*)    RBC 3.84 (*)    Hemoglobin 10.6 (*)    HCT 32.6 (*)    All other components within normal limits  URINALYSIS, ROUTINE W REFLEX MICROSCOPIC - Abnormal; Notable for the following:    APPearance CLOUDY (*)    Protein, ur 100 (*)    All other components within normal limits  POCT I-STAT 3, BLOOD GAS (G3+) - Abnormal; Notable for the following:    pH, Arterial 7.334 (*)    pO2, Arterial 424.0 (*)    Bicarbonate 19.5 (*)    Acid-base deficit 7.0 (*)    All other components within normal limits  URINE MICROSCOPIC-ADD ON - Abnormal; Notable for the following:    Squamous Epithelial / LPF MANY (*)    Bacteria, UA FEW (*)    Casts HYALINE CASTS (*)    All other components within normal limits  GLUCOSE, CAPILLARY - Abnormal; Notable for the following:    Glucose-Capillary 179 (*)    All other components within normal limits  PHOSPHORUS  CARDIAC PANEL(CRET KIN+CKTOT+MB+TROPI)  PRO B NATRIURETIC PEPTIDE  PROTIME-INR  APTT  MRSA PCR SCREENING    CARDIAC PANEL(CRET KIN+CKTOT+MB+TROPI)  CARDIAC PANEL(CRET KIN+CKTOT+MB+TROPI)   Dg Chest Portable 1 View  07/17/2011  *RADIOLOGY REPORT*  Clinical Data: Endotracheal tube placement.  PORTABLE CHEST - 1 VIEW  Comparison: 08/28/2008  Findings: Endotracheal tube terminates  5.8 cm above carina, just above the level of the clavicles.  Midline trachea.  Normal heart size.  No pleural effusion or pneumothorax.  Low lung volumes with resultant pulmonary interstitial prominence.  Mild bibasilar atelectasis.  IMPRESSION:  1.  Endotracheal tube borderline high in position.  Consider advancement 1 - 2 cm. This study was made a "call report". 2.  Low lung volumes, without acute disease.  Original Report Authenticated By: Consuello Bossier, M.D.    6:18 AM Patient seen and examined, Dr Bebe Shaggy is aware and is also seeing the patient.   6:49 AM Patient has been moved to [facility] for possible intubation, Dr Bebe Shaggy has called anesthesia and is with patient.      1. Angioedema       MDM  Patient with severe angioedema, seen initially and discussed immediately with Dr Bebe Shaggy who assumed care of patient for intubation and consultation/admission.  Pt unable to provide much history given severity of edema and need for urgent intervention.          Dillard Cannon [place], [place] 07/17/11 1423  Medical screening examination/treatment/procedure(s) were conducted as a shared visit with non-physician practitioner(s) and myself.  I personally evaluated the patient during the encounter  Pt intubated by anethesia Stabilized in the ED  CRITICAL CARE Performed by: Joya Gaskins   Total critical care time: 35  Critical care time was exclusive of separately billable procedures and treating other patients.  Critical care was necessary to treat or prevent imminent or life-threatening deterioration.  Critical care was time spent personally by me on the following activities: development of treatment plan with patient  and/or surrogate as well as nursing, discussions with consultants, evaluation of patient's response to treatment, examination of patient, obtaining history from patient or surrogate, ordering and performing treatments and interventions, ordering and review of laboratory studies, ordering and review of radiographic studies, pulse oximetry and re-evaluation of patient's condition.   Joya Gaskins, MD 07/17/11 1550

## 2011-07-18 ENCOUNTER — Inpatient Hospital Stay (HOSPITAL_COMMUNITY): Payer: BC Managed Care – PPO

## 2011-07-18 LAB — CBC
Hemoglobin: 11 g/dL — ABNORMAL LOW (ref 12.0–15.0)
MCH: 27.8 pg (ref 26.0–34.0)
MCHC: 32.2 g/dL (ref 30.0–36.0)
MCV: 86.4 fL (ref 78.0–100.0)
Platelets: 355 10*3/uL (ref 150–400)
RBC: 3.96 MIL/uL (ref 3.87–5.11)

## 2011-07-18 LAB — CARDIAC PANEL(CRET KIN+CKTOT+MB+TROPI)
CK, MB: 2.6 ng/mL (ref 0.3–4.0)
Relative Index: INVALID (ref 0.0–2.5)
Total CK: 75 U/L (ref 7–177)
Troponin I: <0.3 ng/mL (ref <0.30)

## 2011-07-18 LAB — DIFFERENTIAL
Eosinophils Relative: 0 % (ref 0–5)
Lymphocytes Relative: 8 % — ABNORMAL LOW (ref 12–46)
Lymphs Abs: 1.4 10*3/uL (ref 0.7–4.0)
Monocytes Absolute: 0.1 10*3/uL (ref 0.1–1.0)
Monocytes Relative: 1 % — ABNORMAL LOW (ref 3–12)

## 2011-07-18 LAB — BLOOD GAS, ARTERIAL
Drawn by: 35849
MECHVT: 500 mL
RATE: 14 resp/min
pCO2 arterial: 38.8 mmHg (ref 35.0–45.0)
pH, Arterial: 7.357 (ref 7.350–7.400)
pO2, Arterial: 104 mmHg — ABNORMAL HIGH (ref 80.0–100.0)

## 2011-07-18 LAB — COMPREHENSIVE METABOLIC PANEL
ALT: 8 U/L (ref 0–35)
AST: 10 U/L (ref 0–37)
Albumin: 3.4 g/dL — ABNORMAL LOW (ref 3.5–5.2)
Alkaline Phosphatase: 95 U/L (ref 39–117)
Calcium: 8.5 mg/dL (ref 8.4–10.5)
GFR calc Af Amer: >90 mL/min (ref >90)
Potassium: 4.5 mEq/L (ref 3.5–5.1)
Sodium: 135 mEq/L (ref 135–145)
Total Protein: 7.4 g/dL (ref 6.0–8.3)

## 2011-07-18 LAB — GLUCOSE, CAPILLARY
Glucose-Capillary: 194 mg/dL — ABNORMAL HIGH (ref 70–99)
Glucose-Capillary: 172 mg/dL — ABNORMAL HIGH (ref 70–99)

## 2011-07-18 MED ORDER — MIDAZOLAM HCL 2 MG/2ML IJ SOLN
2.0000 mg | INTRAMUSCULAR | Status: DC | PRN
  Administered 2011-07-18: 2 mg via INTRAVENOUS
  Administered 2011-07-18 – 2011-07-19 (×2): 4 mg via INTRAVENOUS
  Filled 2011-07-18 (×2): qty 4

## 2011-07-18 MED ORDER — CLONIDINE HCL 0.2 MG PO TABS
0.2000 mg | ORAL_TABLET | Freq: Every day | ORAL | Status: DC
  Filled 2011-07-18: qty 1

## 2011-07-18 MED ORDER — CHLORHEXIDINE GLUCONATE 0.12 % MT SOLN
15.0000 mL | Freq: Two times a day (BID) | OROMUCOSAL | Status: DC
  Administered 2011-07-18 – 2011-07-19 (×3): 15 mL via OROMUCOSAL
  Filled 2011-07-18 (×4): qty 15

## 2011-07-18 MED ORDER — ZOLPIDEM TARTRATE 5 MG PO TABS: 10.0000 mg | ORAL_TABLET | Freq: Every evening | ORAL | Status: DC | PRN

## 2011-07-18 MED ORDER — BIOTENE DRY MOUTH MT LIQD
15.0000 mL | Freq: Four times a day (QID) | OROMUCOSAL | Status: DC
  Administered 2011-07-18 – 2011-07-19 (×8): 15 mL via OROMUCOSAL

## 2011-07-18 MED ORDER — FENTANYL CITRATE 0.05 MG/ML IJ SOLN
50.0000 ug | INTRAMUSCULAR | Status: DC | PRN
  Administered 2011-07-18: 50 ug via INTRAVENOUS
  Administered 2011-07-18 – 2011-07-19 (×4): 100 ug via INTRAVENOUS
  Administered 2011-07-19: 50 ug via INTRAVENOUS
  Administered 2011-07-19: 100 ug via INTRAVENOUS
  Filled 2011-07-18 (×7): qty 2

## 2011-07-18 NOTE — Progress Notes (Signed)
Respiratory Therapy Note- cuff leak performed. Patient did not have a cuff leak. Dr. Vassie Loll notified.

## 2011-07-18 NOTE — Progress Notes (Signed)
Inpatient Diabetes Program Recommendations  AACE/ADA: New Consensus Statement on Inpatient Glycemic Control (2009)  Target Ranges:  Prepandial:   less than 140 mg/dL      Peak postprandial:   less than 180 mg/dL (1-2 hours)      Critically ill patients:  140 - 180 mg/dL   Reason for Visit: Results for Lynn Mayer, Lynn Mayer (MRN [id number]) as of 07/18/2011 08:59  Ref. Range 07/17/2011 15:55 07/17/2011 20:04 07/18/2011 00:12 07/18/2011 04:16 07/18/2011 07:53  Glucose-Capillary Latest Range: 70-99 mg/dL 621 (H) 308 (H) 657 (H) 194 (H) 172 (H)    Inpatient Diabetes Program Recommendations Correction (SSI): Increase Correction Novolog to moderate q 4 hours.  Note: Will follow.

## 2011-07-18 NOTE — Progress Notes (Signed)
Name: Lynn Mayer MRN: [id number] DOB: [birth year]    LOS: 1 Requesting MD: Bebe Shaggy EDP  PCCM FU NOTE  History of Present Illness: 51 yo AAF who went to bed 3/17 with no issue. Awoke the am of 3/18 with tongue swelling and airway compromise.Med list includes ACE (-) Transported to [facility] ED  And urgently intubated by anesthesia with #6 OTT via scope. PCCM asked to admit.  Lines / Drains: 3/18 ott>>  Cultures: none  Antibiotics: none  Tests / Events: 3/18- difficult intubation  Subjective: Awake  and alert.denies pain, dyspnea Past Medical History  Diagnosis Date  . Hypertension   . Diabetes mellitus   . Hyperlipidemia   . Peripheral neuropathy     Vital Signs: Temp:  [98.1 F (36.7 C)-98.7 F (37.1 C)] 98.5 F (36.9 C) (03/19 1205) Pulse Rate:  [79-106] 106  (03/19 1304) Resp:  [12-26] 17  (03/19 1304) BP: (98-135)/(55-93) 122/80 mmHg (03/19 1304) SpO2:  [99 %-100 %] 100 % (03/19 1304) FiO2 (%):  [40 %] 40 % (03/19 1304) Weight:  [99.3 kg (218 lb 14.7 oz)] 99.3 kg (218 lb 14.7 oz) (03/19 0500) I/O last 3 completed shifts: In: 1062 [I.V.:962; IV Piggyback:100] Out: 1330 [Urine:1330]  Physical Examination: General:  wnwd aaf, RASS 1-2 Neuro: intact  HEENT:  Bloody sputum resolving Cardiovascular:  hsr rrr Lungs:  cta Abdomen:  Obese + bs Musculoskeletal:  intact Skin:  Lt elbow with large growth  Ventilator settings: Vent Mode:  [-] CPAP FiO2 (%):  [40 %] 40 % Set Rate:  [16 bmp] 16 bmp Vt Set:  [500 mL] 500 mL PEEP:  [5 cmH20] 5 cmH20 Pressure Support:  [5 cmH20] 5 cmH20 Plateau Pressure:  [7 cmH20-18 cmH20] 10 cmH20  Labs and Imaging:  Dg Chest Port 1 View  07/18/2011  *RADIOLOGY REPORT*  Clinical Data: Evaluate endotracheal tube placement.  PORTABLE CHEST - 1 VIEW  Comparison: 07/17/2011.  Findings: Endotracheal tube tip 4.8 cm above the carina.  Cardiomegaly.  Central pulmonary vascular prominence.  Mildly tortuous aorta.  No segmental  infiltrate or gross pneumothorax.  IMPRESSION: Endotracheal tube tip 4.8 cm above the carina.  Tortuous aorta.  Cardiomegaly.  Original Report Authenticated By: Fuller Canada, M.D.   Dg Chest Portable 1 View  07/17/2011  *RADIOLOGY REPORT*  Clinical Data: Endotracheal tube placement.  PORTABLE CHEST - 1 VIEW  Comparison: 08/28/2008  Findings: Endotracheal tube terminates  5.8 cm above carina, just above the level of the clavicles.  Midline trachea.  Normal heart size.  No pleural effusion or pneumothorax.  Low lung volumes with resultant pulmonary interstitial prominence.  Mild bibasilar atelectasis.  IMPRESSION:  1.  Endotracheal tube borderline high in position.  Consider advancement 1 - 2 cm. This study was made a "call report". 2.  Low lung volumes, without acute disease.  Original Report Authenticated By: Consuello Bossier, M.D.    Lab 07/18/11 0450 07/17/11 0750  NA 135 136  K 4.5 3.5  CL 103 104  CO2 20 18*  BUN 13 13  CREATININE 0.68 0.75  GLUCOSE 179* 196*    Lab 07/18/11 0450 07/17/11 0750  HGB 11.0* 10.6*  HCT 34.2* 32.6*  WBC 17.2* 18.4*  PLT 355 308   ABG    Component Value Date/Time   PHART 7.357 07/18/2011 0511    Assessment and Plan: Acute resp failure secondary to angioedema ? Trigger ACEI (need home med list) -Ok to wean but no extubation, chk cuff leak daily -  steroids -H2 blockers bendaryl x 4 doses -hold all antihypertensives   Agitation -resolved, sedation, intermittent   HTN -hold all antihypertensives -after confirm ACEI, then add as allergy   DM -ssi addition,  dm with steroids   Best practices / Disposition: -->ICU status under PCCM -->full code -->pas for DVT Px -->Pepcid  for GI Px -->ventilator bundle -->diet npo  Ccm time 35 min  Cyril Mourning MD. FCCP. [place] [facility] Pager [phone] If no response call [phone]

## 2011-07-18 NOTE — Procedures (Deleted)
Central Venous Catheter Insertion Procedure Note KASHEENA SAMBRANO [id number] 12/23/1960  Procedure: Insertion of Central Venous Catheter Indications: Assessment of intravascular volume, Drug and/or fluid administration and Frequent blood sampling  Procedure Details Consent: Risks of procedure as well as the alternatives and risks of each were explained to the (patient/caregiver).  Consent for procedure obtained. Time Out: Verified patient identification, verified procedure, site/side was marked, verified correct patient position, special equipment/implants available, medications/allergies/relevent history reviewed, required imaging and test results available.  Performed Real time Ultra sound imaging used at the bedside in order to identify and cannulate the right Internal jugular vein  Maximum sterile technique was used including antiseptics, cap, gloves, gown, hand hygiene, mask and sheet. Skin prep: Chlorhexidine; local anesthetic administered A antimicrobial bonded/coated triple lumen catheter was placed in the right internal jugular vein using the Seldinger technique.  Evaluation Blood flow good Complications: No apparent complications Patient did tolerate procedure well. Chest X-ray ordered to verify placement.  CXR: pending.  Givanni Staron,PETE 07/18/2011, 10:44 AM

## 2011-07-19 ENCOUNTER — Inpatient Hospital Stay (HOSPITAL_COMMUNITY): Payer: BC Managed Care – PPO

## 2011-07-19 LAB — GLUCOSE, CAPILLARY
Glucose-Capillary: 167 mg/dL — ABNORMAL HIGH (ref 70–99)
Glucose-Capillary: 167 mg/dL — ABNORMAL HIGH (ref 70–99)
Glucose-Capillary: 171 mg/dL — ABNORMAL HIGH (ref 70–99)
Glucose-Capillary: 185 mg/dL — ABNORMAL HIGH (ref 70–99)

## 2011-07-19 LAB — BASIC METABOLIC PANEL
CO2: 23 mEq/L (ref 19–32)
Calcium: 8.5 mg/dL (ref 8.4–10.5)
Creatinine, Ser: 0.66 mg/dL (ref 0.50–1.10)
GFR calc non Af Amer: >90 mL/min (ref >90)

## 2011-07-19 LAB — CBC
MCH: 27.2 pg (ref 26.0–34.0)
MCV: 86.5 fL (ref 78.0–100.0)
Platelets: 336 10*3/uL (ref 150–400)
RDW: 16 % — ABNORMAL HIGH (ref 11.5–15.5)
WBC: 14.9 10*3/uL — ABNORMAL HIGH (ref 4.0–10.5)

## 2011-07-19 MED ORDER — LORAZEPAM 2 MG/ML IJ SOLN
2.0000 mg | INTRAMUSCULAR | Status: DC | PRN
  Administered 2011-07-19: 2 mg via INTRAVENOUS
  Filled 2011-07-19: qty 1

## 2011-07-19 MED ORDER — INSULIN ASPART 100 UNIT/ML ~~LOC~~ SOLN
0.0000 [IU] | Freq: Three times a day (TID) | SUBCUTANEOUS | Status: DC
  Administered 2011-07-19 – 2011-07-20 (×3): 2 [IU] via SUBCUTANEOUS
  Administered 2011-07-20: 5 [IU] via SUBCUTANEOUS
  Administered 2011-07-21 (×2): 1 [IU] via SUBCUTANEOUS

## 2011-07-19 MED ORDER — FENTANYL CITRATE 0.05 MG/ML IJ SOLN
25.0000 ug | INTRAMUSCULAR | Status: DC | PRN
  Administered 2011-07-19 (×2): 25 ug via INTRAVENOUS
  Administered 2011-07-20: 50 ug via INTRAVENOUS
  Filled 2011-07-19 (×3): qty 2

## 2011-07-19 MED ORDER — MORPHINE SULFATE 4 MG/ML IJ SOLN: 4.0000 mg | INTRAMUSCULAR | Status: DC | PRN

## 2011-07-19 NOTE — Progress Notes (Signed)
Name: Lynn Mayer MRN: [id number] DOB: April 15, 1961    LOS: 2 Requesting MD: Bebe Shaggy EDP  PCCM FU NOTE  History of Present Illness: 51 yo AAF who went to bed 3/17 with no issue. Awoke the am of 3/18 with tongue swelling and airway compromise.Med list includes ACE (-) Transported to [facility] ED  And urgently intubated by anesthesia with #6 OTT via scope. PCCM asked to admit.  Lines / Drains: 3/18 ott>>3/20  Cultures: none  Antibiotics: none  Tests / Events: 3/18- difficult intubation  Subjective: Awake  and alert.denies pain, dyspnea Past Medical History  Diagnosis Date  . Hypertension   . Diabetes mellitus   . Hyperlipidemia   . Peripheral neuropathy      Vital Signs: Temp:  [98.1 F (36.7 C)-99 F (37.2 C)] 98.1 F (36.7 C) (03/20 1225) Pulse Rate:  [73-107] 99  (03/20 1400) Resp:  [12-30] 19  (03/20 1400) BP: (112-151)/(72-118) 133/83 mmHg (03/20 1400) SpO2:  [96 %-100 %] 100 % (03/20 1400) FiO2 (%):  [0.2 %-40 %] 0.2 % (03/20 0757) Weight:  [95.1 kg (209 lb 10.5 oz)] 95.1 kg (209 lb 10.5 oz) (03/20 0500) I/O last 3 completed shifts: In: 1840 [I.V.:1740; IV Piggyback:100] Out: 2280 [Urine:2280]  Physical Examination: General:  wnwd aaf, RASS 1-2 Neuro:non focal  HEENT:  Bloody sputum resolved Cardiovascular:  hsr rrr Lungs:  cta Abdomen:  Obese + bs Musculoskeletal:  intact Skin:  Lt elbow with large growth  Ventilator settings: Vent Mode:  [-] PSV;CPAP FiO2 (%):  [0.2 %-40 %] 0.2 % Set Rate:  [16 bmp] 16 bmp Vt Set:  [500 mL] 500 mL PEEP:  [5 cmH20] 5 cmH20 Pressure Support:  [5 cmH20] 5 cmH20 Plateau Pressure:  [14 cmH20-16 cmH20] 16 cmH20  Labs and Imaging:  Dg Chest Port 1 View  07/18/2011  *RADIOLOGY REPORT*  Clinical Data: Evaluate endotracheal tube placement.  PORTABLE CHEST - 1 VIEW  Comparison: 07/17/2011.  Findings: Endotracheal tube tip 4.8 cm above the carina.  Cardiomegaly.  Central pulmonary vascular prominence.  Mildly  tortuous aorta.  No segmental infiltrate or gross pneumothorax.  IMPRESSION: Endotracheal tube tip 4.8 cm above the carina.  Tortuous aorta.  Cardiomegaly.  Original Report Authenticated By: Fuller Canada, M.D.    Lab 07/19/11 0415 07/18/11 0450 07/17/11 0750  NA 138 135 136  K 4.2 4.5 3.5  CL 106 103 104  CO2 23 20 18*  BUN 15 13 13   CREATININE 0.66 0.68 0.75  GLUCOSE 173* 179* 196*    Lab 07/19/11 0415 07/18/11 0450 07/17/11 0750  HGB 10.3* 11.0* 10.6*  HCT 32.8* 34.2* 32.6*  WBC 14.9* 17.2* 18.4*  PLT 336 355 308   ABG    Component Value Date/Time   PHART 7.357 07/18/2011 0511    Assessment and Plan: Acute resp failure secondary to angioedema ? Trigger ACEI (need home med list) - cuff leak ok - extubate -dc steroids & H2 blockers bendaryl x 4 doses given    Agitation -resolved, sedation, intermittent   HTN -hold all antihypertensives, restart in 24h  -after confirm ACEI, then add as allergy   DM -ssi addition,  dm with steroids   Best practices / Disposition: -->ICU status under PCCM -->full code -->pas for DVT Px -->diet advance  Ccm time 35 min  Cyril Mourning MD. FCCP. [facility] Pager [phone] If no response call [phone]

## 2011-07-19 NOTE — Progress Notes (Signed)
Pt extubated to RA after successful SBT with positive audible cuff leak.  Pt has strong cough.  SPo2 on RA 98%.  Pt able to vocalize, still complains of sore throat.  Will continue to monitor.

## 2011-07-19 NOTE — Progress Notes (Signed)
UR Completed.  Lynn Mayer Jane [phone] 07/19/2011  

## 2011-07-20 LAB — GLUCOSE, CAPILLARY
Glucose-Capillary: 291 mg/dL — ABNORMAL HIGH (ref 70–99)
Glucose-Capillary: 127 mg/dL — ABNORMAL HIGH (ref 70–99)

## 2011-07-20 MED ORDER — GLIMEPIRIDE 4 MG PO TABS
4.0000 mg | ORAL_TABLET | Freq: Every day | ORAL | Status: DC
  Administered 2011-07-21: 4 mg via ORAL
  Filled 2011-07-20 (×2): qty 1

## 2011-07-20 MED ORDER — METFORMIN HCL 500 MG PO TABS
500.0000 mg | ORAL_TABLET | Freq: Every day | ORAL | Status: DC
  Administered 2011-07-21: 500 mg via ORAL
  Filled 2011-07-20 (×2): qty 1

## 2011-07-20 MED ORDER — GABAPENTIN 300 MG PO CAPS
300.0000 mg | ORAL_CAPSULE | Freq: Every day | ORAL | Status: DC
  Administered 2011-07-20: 300 mg via ORAL
  Filled 2011-07-20 (×2): qty 1

## 2011-07-20 MED ORDER — AMLODIPINE BESYLATE 10 MG PO TABS
10.0000 mg | ORAL_TABLET | Freq: Every day | ORAL | Status: DC
  Administered 2011-07-20 – 2011-07-21 (×2): 10 mg via ORAL
  Filled 2011-07-20 (×2): qty 1

## 2011-07-20 MED ORDER — ACETAMINOPHEN 325 MG PO TABS
650.0000 mg | ORAL_TABLET | Freq: Four times a day (QID) | ORAL | Status: DC | PRN
  Administered 2011-07-20 – 2011-07-21 (×5): 650 mg via ORAL
  Filled 2011-07-20 (×5): qty 2

## 2011-07-20 MED ORDER — MENTHOL 3 MG MT LOZG
1.0000 | LOZENGE | OROMUCOSAL | Status: DC | PRN
  Filled 2011-07-20 (×2): qty 9

## 2011-07-20 MED ORDER — CLONIDINE HCL 0.2 MG PO TABS
0.2000 mg | ORAL_TABLET | Freq: Every day | ORAL | Status: DC
  Administered 2011-07-20: 0.2 mg via ORAL
  Filled 2011-07-20 (×2): qty 1

## 2011-07-20 NOTE — Progress Notes (Signed)
Name: Lynn Mayer MRN: [id number] DOB: 1960-10-01    LOS: 3 Requesting MD: Bebe Shaggy EDP  PCCM FU NOTE  PCP - Claudie Revering  History of Present Illness: 51 yo AAF who went to bed 3/17 with no issue. Awoke the am of 3/18 with tongue swelling and airway compromise.Med list includes ACE (-) Transported to [facility] ED  And urgently intubated by anesthesia with #6 OTT via scope. PCCM asked to admit.  Lines / Drains: 3/18 ott>>3/20  Cultures: none  Antibiotics: none  Tests / Events: 3/18- difficult intubation  Subjective: Awake  and alert.denies pain, dyspnea Past Medical History  Diagnosis Date  . Hypertension   . Diabetes mellitus   . Hyperlipidemia   . Peripheral neuropathy      Vital Signs: Temp:  [98 F (36.7 C)-98.7 F (37.1 C)] 98.7 F (37.1 C) (03/21 0759) Pulse Rate:  [70-111] 111  (03/21 1200) Resp:  [11-43] 13  (03/21 0700) BP: (133-160)/(74-107) 154/95 mmHg (03/21 1200) SpO2:  [94 %-100 %] 100 % (03/21 1200) I/O last 3 completed shifts: In: 2650 [P.O.:1300; I.V.:1250; IV Piggyback:100] Out: 1450 [Urine:1450]  Much improved, oob to chair, throat sore  Physical Examination: General:  wnwd aaf, RASS 1-2 Neuro:non focal  HEENT:  Bloody sputum resolved Cardiovascular:  hsr rrr Lungs:  cta Abdomen:  Obese + bs Musculoskeletal:  intact Skin:  Lt elbow with large growth  Ventilator settings:    Labs and Imaging:  No results found.  Lab 07/19/11 0415 07/18/11 0450 07/17/11 0750  NA 138 135 136  K 4.2 4.5 3.5  CL 106 103 104  CO2 23 20 18*  BUN 15 13 13   CREATININE 0.66 0.68 0.75  GLUCOSE 173* 179* 196*    Lab 07/19/11 0415 07/18/11 0450 07/17/11 0750  HGB 10.3* 11.0* 10.6*  HCT 32.8* 34.2* 32.6*  WBC 14.9* 17.2* 18.4*  PLT 336 355 308   ABG    Component Value Date/Time   PHART 7.357 07/18/2011 0511    Assessment and Plan: Acute resp failure secondary to angioedema ? Trigger ACEI (need home med list) -dc'd  steroids & H2  blockers bendaryl x 4 doses given   HTN -resume amlodipine & clonidine -after confirm ACEI, then add as allergy   DM -ssi addition,  dm with steroids   Best practices / Disposition: [facility] to dc in 24 h -->to floor under PCCM -->full code -->pas for DVT Px -->diet advance   Cyril Mourning MD. Tonny Bollman. [place] [facility] Pager [phone] If no response call [phone]

## 2011-07-21 LAB — GLUCOSE, CAPILLARY
Glucose-Capillary: 148 mg/dL — ABNORMAL HIGH (ref 70–99)
Glucose-Capillary: 148 mg/dL — ABNORMAL HIGH (ref 70–99)

## 2011-07-21 MED ORDER — HYDROCHLOROTHIAZIDE 12.5 MG PO TABS: 12.5000 mg | ORAL_TABLET | Freq: Every day | ORAL | Status: AC

## 2011-07-21 MED ORDER — MENTHOL 3 MG MT LOZG: 1.0000 | LOZENGE | OROMUCOSAL | Status: AC | PRN

## 2011-07-21 NOTE — Progress Notes (Signed)
Name: Lynn Mayer MRN: [id number] DOB: Jun 30, 1960    LOS: 4 Requesting MD: Bebe Shaggy EDP  PCCM FU NOTE  PCP - Claudie Revering  History of Present Illness: 51 yo AAF who went to bed 3/17 with no issue. Awoke the am of 3/18 with tongue swelling and airway compromise.Med list includes ACE (-) Transported to [facility] ED  And urgently intubated by anesthesia with #6 OTT via scope. PCCM asked to admit.  Lines / Drains: 3/18 ott>>3/20  Cultures: none  Antibiotics: none  Tests / Events: 3/18- difficult intubation  Subjective: Feels well this morning, still has some sore throat  Past Medical History  Diagnosis Date  . Hypertension   . Diabetes mellitus   . Hyperlipidemia   . Peripheral neuropathy      Vital Signs: Temp:  [98 F (36.7 C)-98.7 F (37.1 C)] 98.7 F (37.1 C) (03/22 0825) Pulse Rate:  [70-111] 70  (03/22 0825) Resp:  [16-20] 18  (03/22 0825) BP: (114-154)/(73-97) 137/86 mmHg (03/22 0825) SpO2:  [97 %-100 %] 97 % (03/22 0825) I/O last 3 completed shifts: In: 780 [P.O.:680; I.V.:100] Out: -     Physical Examination: General:  No acute distress HEENT: OP no lesions, swelling, exudate, neck supple PULM: no stridor, CTA CV: RRR, nl S1/S2 AB: BS+, soft, nontender, no hsme Ext: no edema  Ventilator settings:    Labs and Imaging:  No results found.  Lab 07/19/11 0415 07/18/11 0450 07/17/11 0750  NA 138 135 136  K 4.2 4.5 3.5  CL 106 103 104  CO2 23 20 18*  BUN 15 13 13   CREATININE 0.66 0.68 0.75  GLUCOSE 173* 179* 196*    Lab 07/19/11 0415 07/18/11 0450 07/17/11 0750  HGB 10.3* 11.0* 10.6*  HCT 32.8* 34.2* 32.6*  WBC 14.9* 17.2* 18.4*  PLT 336 355 308   ABG    Component Value Date/Time   PHART 7.357 07/18/2011 0511    Assessment and Plan: Acute resp failure secondary to angioedema ? Trigger ACEI (need home med list) -clinically improved   HTN -home amlodipine & clonidine    DM -ssi   Sore throat -advised hot teal,  chlorasceptic -f/u with ENT in 1-2 weeks if no improvement   Best practices / Disposition: [facility] to dc in 24 h -->d/c home today -->full code -->pas for DVT Px -->diet advance

## 2011-07-21 NOTE — Discharge Summary (Signed)
Kilyn Maragh Sun PCCM Pager: [phone] If no response, call [phone]  

## 2011-07-21 NOTE — Discharge Summary (Signed)
Physician Discharge Summary  Patient ID: Lynn Mayer MRN: [id number] DOB/AGE: 1960/09/09 51 y.o.  Admit date: 07/17/2011 Discharge date: 07/21/2011    Discharge Diagnoses:   1. Acute respiratory failure secondary to airway obstruction 2. Angioedema suspected secondary to ACE inhibitor  3. DM 4. HTN    Brief Summary: Lynn Mayer is a 51 y.o. y/o AA female with a PMH of HTN on Lisinopril who presented to [facility] ED on 3/18 with tongue swelling and airway compromise / obstruction.  Evening prior she went to bed with no noted issues.  She required emergent intubation by Anesthesia with #6.0 ETT via bronchoscope.  PCCM called for admission to ICU for ventilatory support.  She was maintained on mechanical ventilation, IV steroids, H2 blockers, benadryl IV for airway swelling.   After cuff leak on 3/20 patient was successfully liberated from mechanical ventilation.  Airway / facial swelling resolved.  Lisinopril was suspected source for angioedema.  Anti-hypertensives were initially held and home amlodipine, clonidine and HCTZ were resumed for blood pressure control.  Hyperglycemia was controlled with sliding scale insulin.  Prior to discharge, Lynn Mayer was educated on no further ACE inhibitor use and she indicates verbal understanding.    Lines / Drains:  3/18 ott>>3/20   Cultures:  none   Antibiotics:  none   Tests / Events:  3/18- difficult intubation   Discharge Exam: General: No acute distress  HEENT: OP no lesions, no swelling or exudate, neck supple  PULM: no stridor, CTA  CV: RRR, nl S1/S2  AB: BS+, soft, nontender, no hsme  Ext: no edema   Discharge Labs  BMET  Lab 07/19/11 0415 07/18/11 0450 07/17/11 0750  NA 138 135 136  K 4.2 4.5 --  CL 106 103 104  CO2 23 20 18*  GLUCOSE 173* 179* 196*  BUN 15 13 13   CREATININE 0.66 0.68 0.75  CALCIUM 8.5 8.5 8.7  MG -- -- 1.3*  PHOS -- -- 3.8     CBC  Lab 07/19/11 0415 07/18/11 0450 07/17/11 0750  HGB  10.3* 11.0* 10.6*  HCT 32.8* 34.2* 32.6*  WBC 14.9* 17.2* 18.4*  PLT 336 355 308   Anti-Coagulation  Lab 07/17/11 0750  INR 0.93    Follow-up Information    Follow up with Dorrene German, MD on 08/03/2011. (Appt at 11:15 on [facility].  Your April 23rd appt is canceled.  )    Contact information:   [address] [place] [address] [phone]           Discharge Orders    Future Orders Please Complete By Expires   Diet - low sodium heart healthy      Increase activity slowly      Discharge instructions      Comments:   No further ACE inhibitor medications.  This should be added to your allergy list.   Call MD for:      Comments:   Any new airway, tongue, face swelling -- Report to ED.      DISCHARGE MEDICATIONS    Lynn Mayer, Lynn Mayer  Home Medication Instructions OZH:[id number]   Printed on:07/21/11 1456  Medication Information                    glimepiride (AMARYL) 4 MG tablet Take 4 mg by mouth daily before breakfast.           amLODipine (NORVASC) 10 MG tablet Take 10 mg by mouth daily.  gabapentin (NEURONTIN) 300 MG capsule Take 300 mg by mouth at bedtime.           loratadine (CLARITIN) 10 MG tablet Take 10 mg by mouth daily.           metFORMIN (GLUCOPHAGE) 500 MG tablet Take 500 mg by mouth daily with breakfast.           cloNIDine (CATAPRES) 0.2 MG tablet Take 0.2 mg by mouth at bedtime. For night sweats           zolpidem (AMBIEN) 10 MG tablet Take 10 mg by mouth at bedtime as needed.           menthol-cetylpyridinium (CEPACOL) 3 MG lozenge Take 1 lozenge (3 mg total) by mouth every 4 (four) hours as needed.           hydrochlorothiazide (HYDRODIURIL) 12.5 MG tablet Take 1 tablet (12.5 mg total) by mouth daily.              Follow-up Information    Follow up with Dorrene German, MD on 08/03/2011. (Appt at 11:15 on [facility].  Your April 23rd appt is canceled.  )    Contact  information:   [address] [place] [address] [phone]          Disposition:  Home / Self Care.  Pt is able to ambulate without assistance.    Discharged Condition: Lynn Mayer has met maximum benefit of inpatient care and is medically stable and cleared for discharge.  Patient is pending follow up as above.      Time spent on disposition:  Greater than 35 minutes.   Signed: Canary Brim, NP-C  Pulmonary & Critical Care Pgr: [phone]

## 2012-03-20 IMAGING — CR DG SHOULDER 2+V*R*
2 series · 2 of 2 positions shown · non-contrast
Comparison: 08/10/2009

CLINICAL DATA: Right shoulder pain

RIGHT SHOULDER - 2+ VIEW

[w shoulder ap internal right]
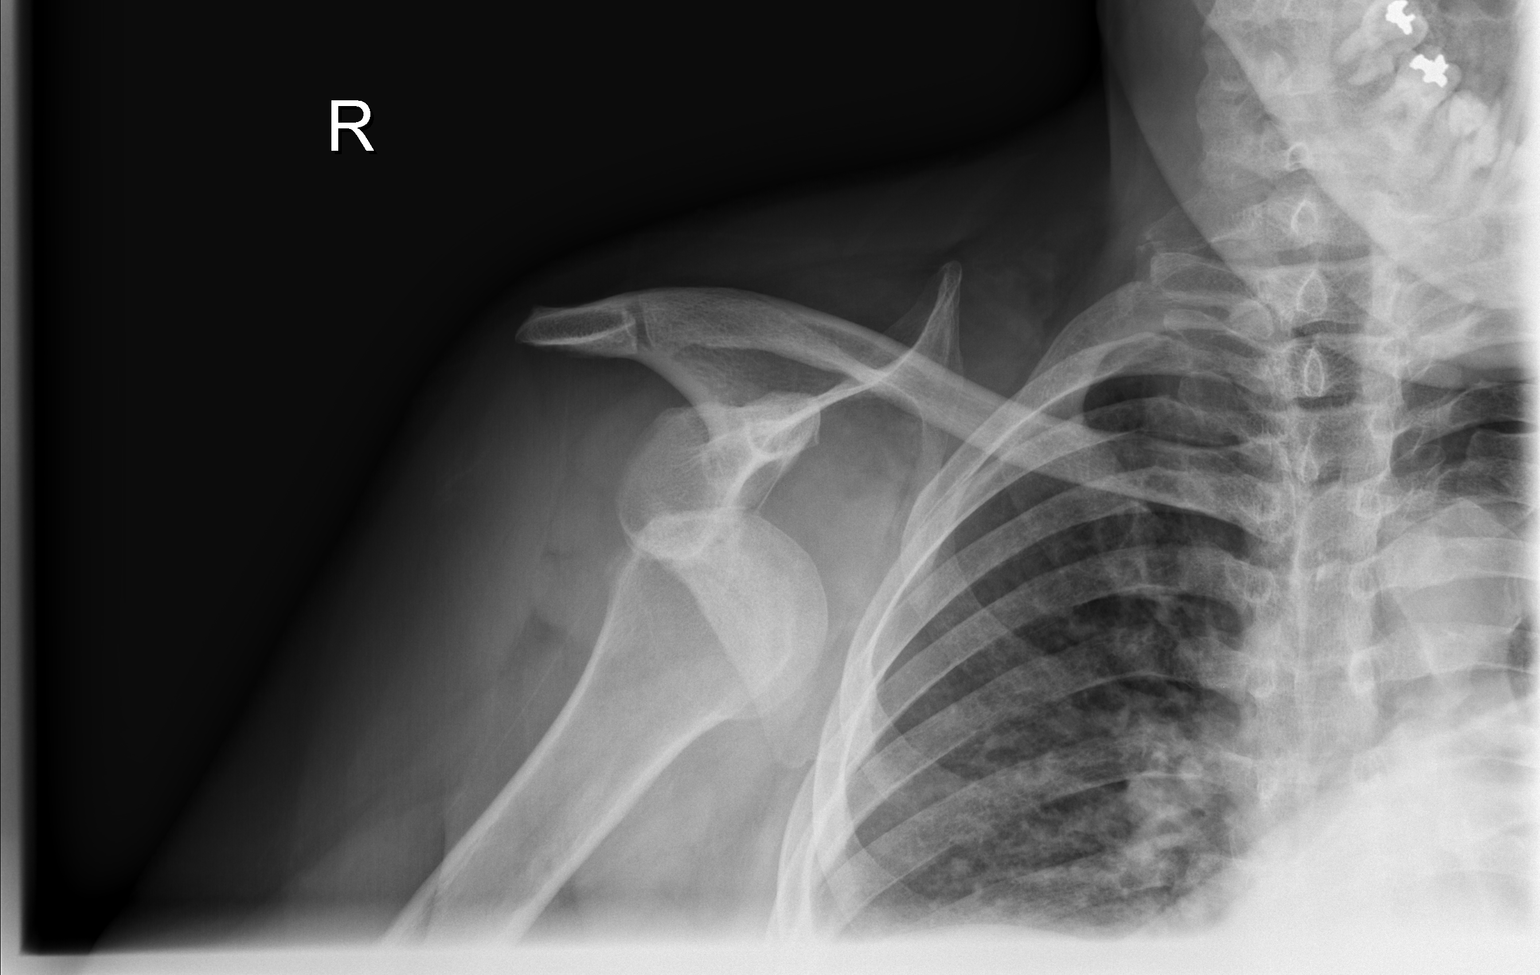

[w shoulder y view right]
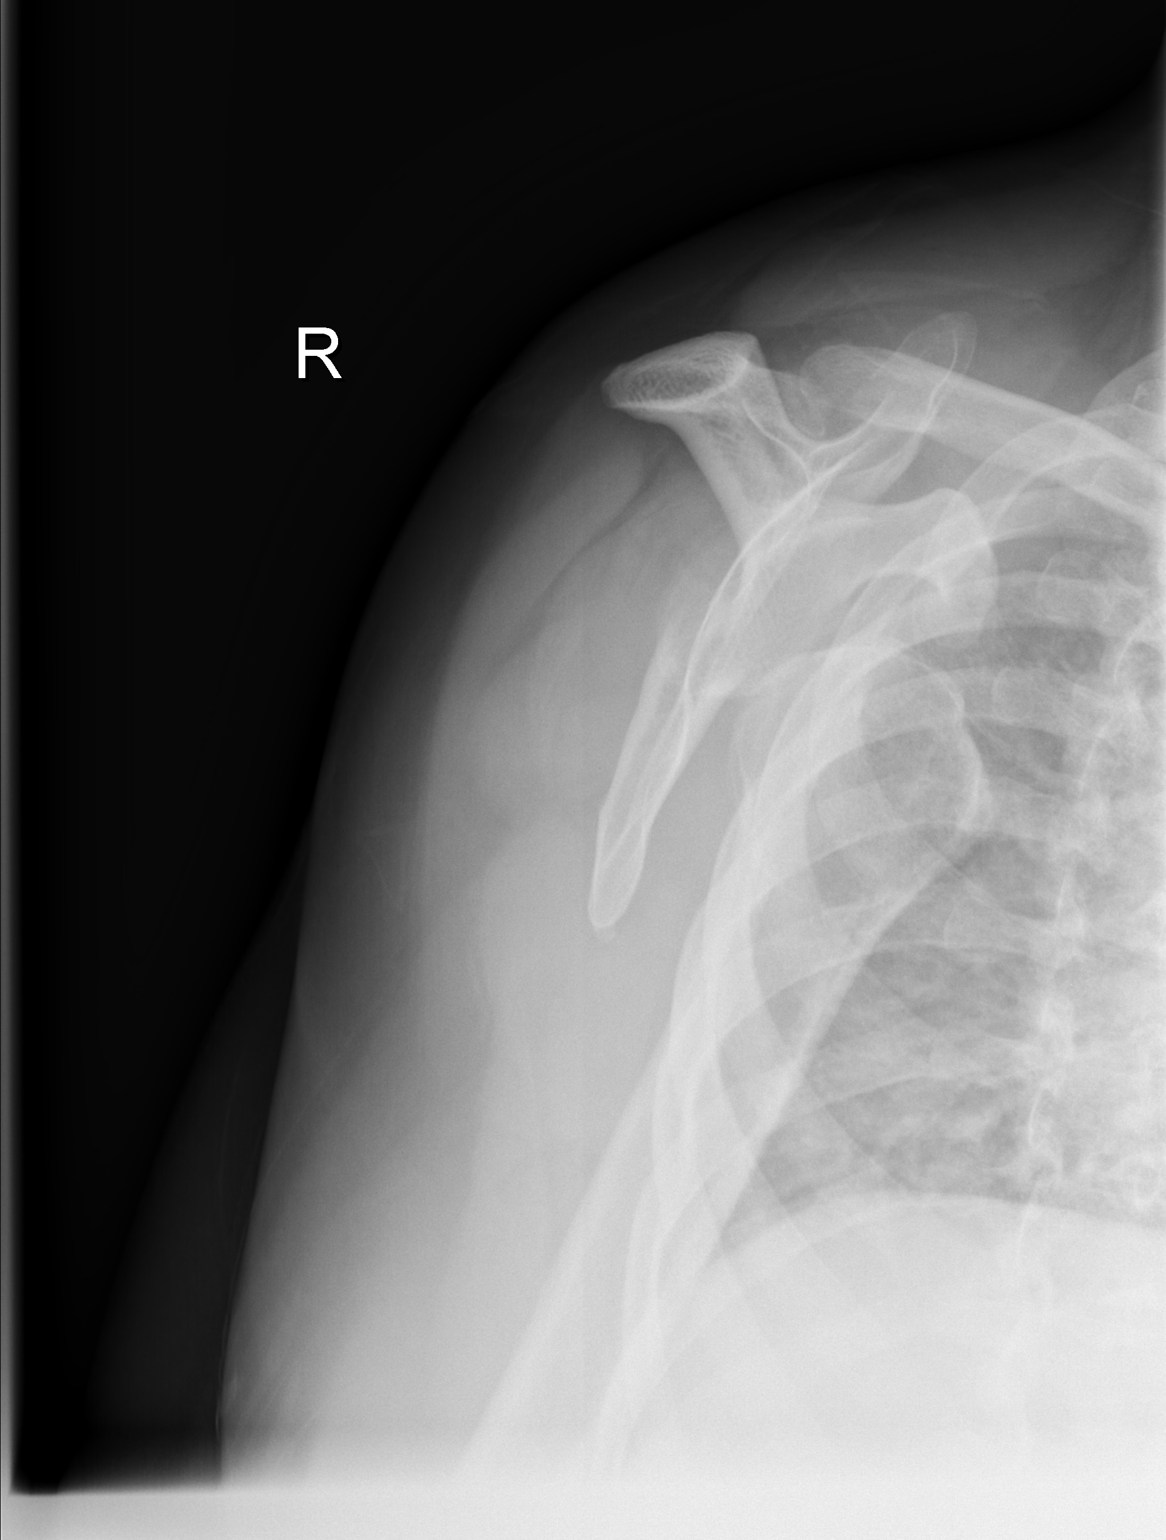

[2 of 2 positions shown; findings below may reference images not displayed]

FINDINGS: Again noted is anteroinferior dislocation of the right
humeral head with respect to the glenoid.  No fracture line is
visualized.  AC joint is normal.  Lung apex is clear.
IMPRESSION: Anteroinferior dislocation of the right humeral head again noted.

## 2012-06-24 IMAGING — CR DG SHOULDER 2+V PORT*R*
2 series · 2 of 2 positions shown · non-contrast
Comparison: 08/31/2009.

CLINICAL DATA: Dislocated right shoulder.

PORTABLE RIGHT SHOULDER - 2+ VIEW

[view not recorded (1 of 2)]
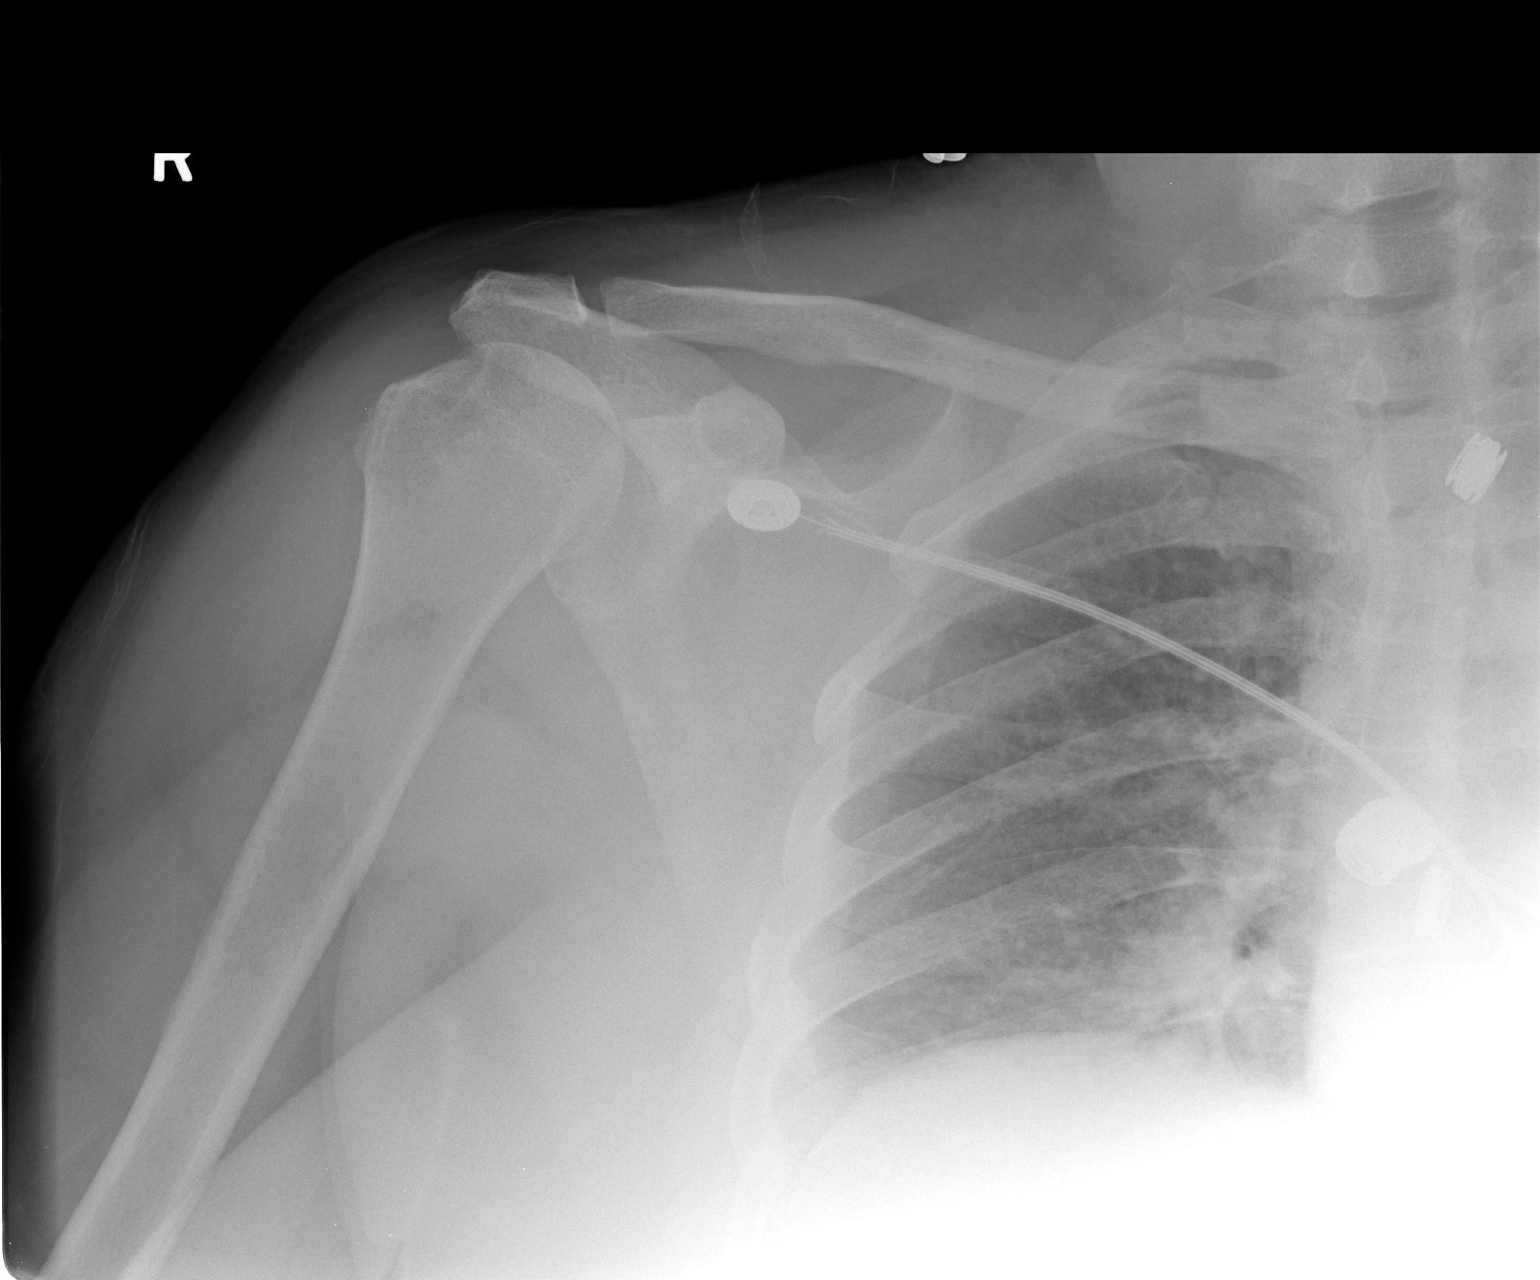

[view not recorded (2 of 2)]
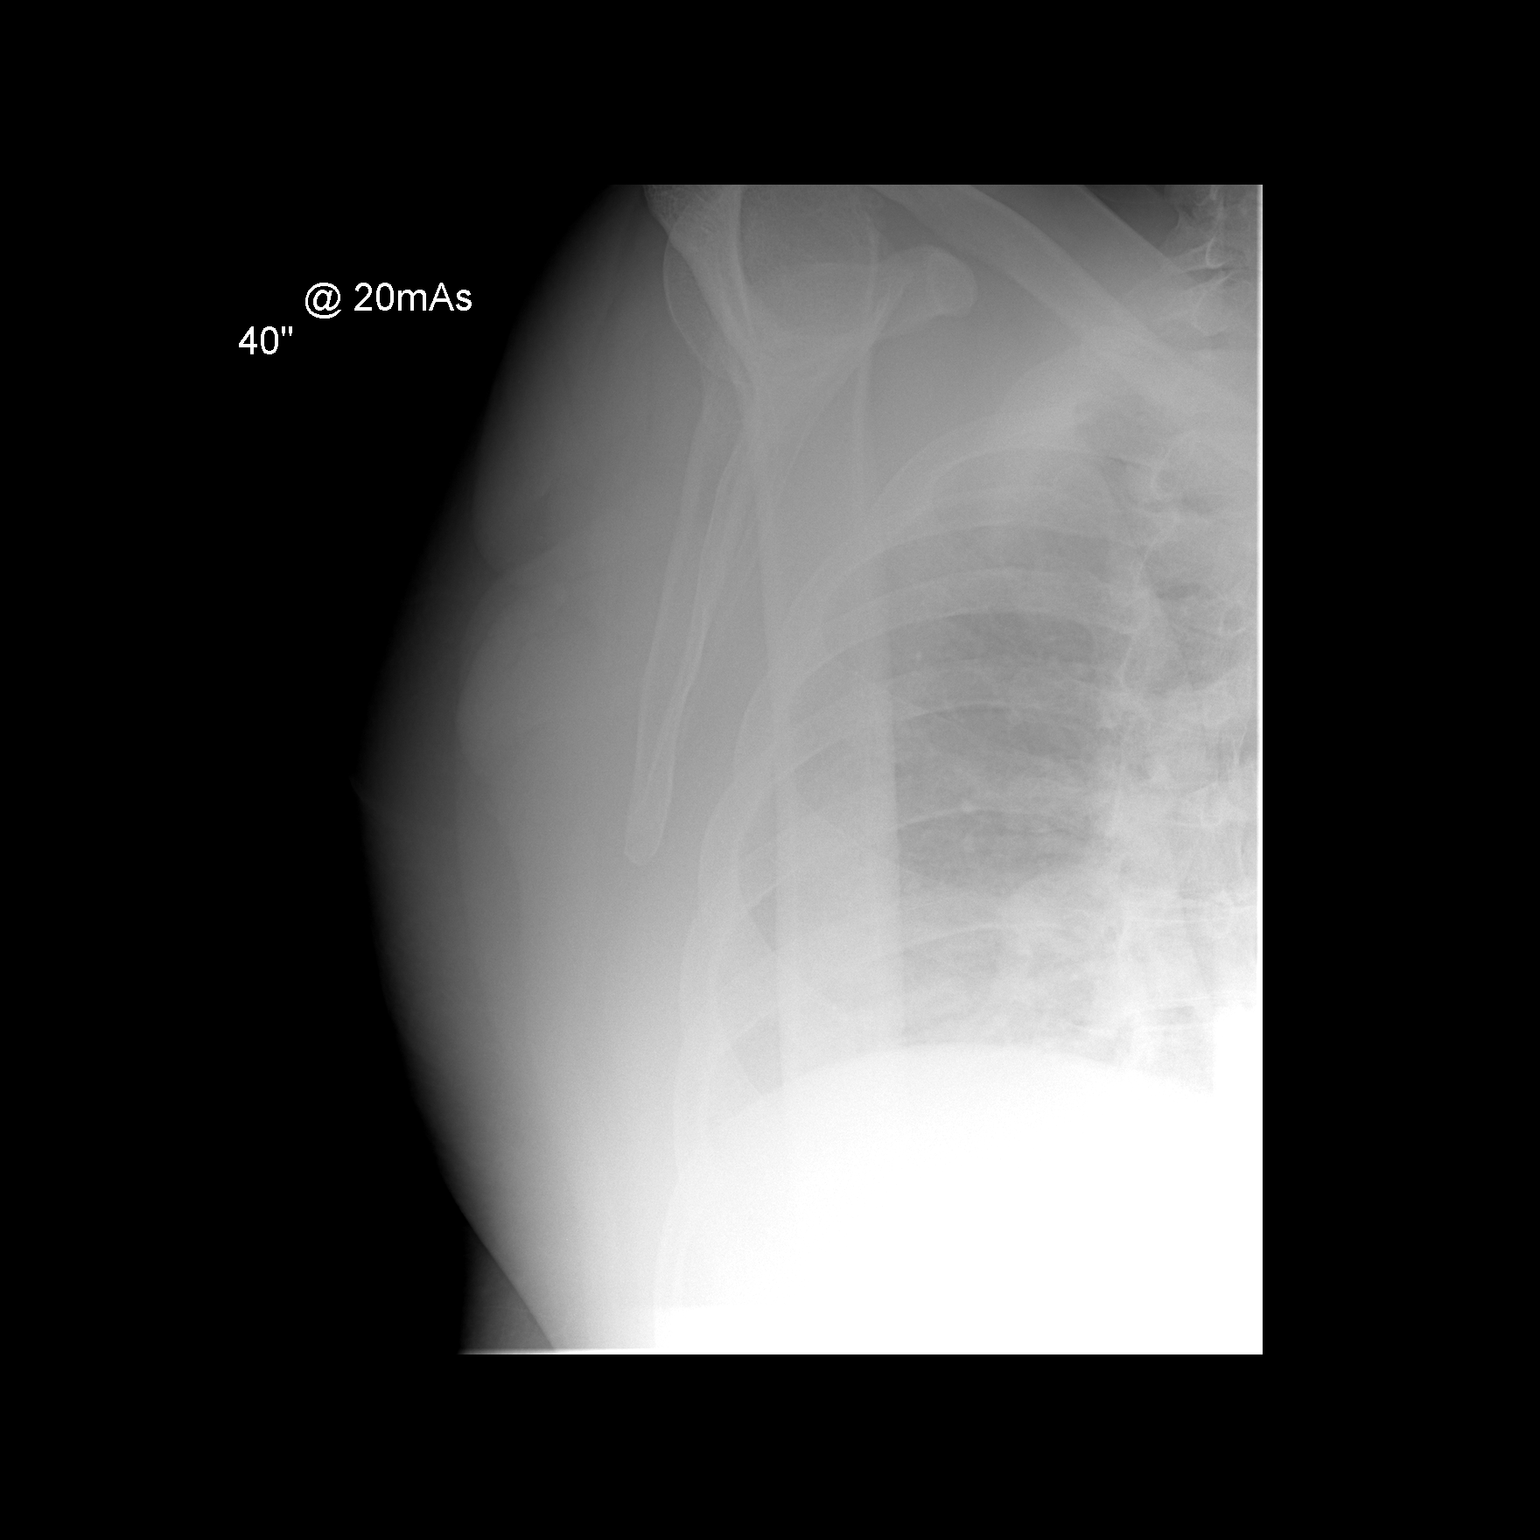

[2 of 2 positions shown; findings below may reference images not displayed]

FINDINGS: The right shoulder is located.  A Hill-Sachs deformity is
noted.  The right hemithorax demonstrates a low lung volume.  No
focal airspace disease is present.
IMPRESSION: 1.  The right shoulder is located.
2.  A Hill-Sachs deformity is present.

## 2013-10-08 ENCOUNTER — Encounter (HOSPITAL_COMMUNITY): Payer: Self-pay | Admitting: Emergency Medicine

## 2013-10-08 ENCOUNTER — Emergency Department (HOSPITAL_COMMUNITY)
Admission: EM | Admit: 2013-10-08 | Discharge: 2013-10-09 | Disposition: A | Discharge status: Home / Self Care | Payer: BC Managed Care – PPO | Attending: Emergency Medicine | Admitting: Emergency Medicine

## 2013-10-08 DIAGNOSIS — E119 Type 2 diabetes mellitus without complications: Secondary | ICD-10-CM | POA: Insufficient documentation

## 2013-10-08 DIAGNOSIS — I10 Essential (primary) hypertension: Secondary | ICD-10-CM | POA: Insufficient documentation

## 2013-10-08 DIAGNOSIS — Z8669 Personal history of other diseases of the nervous system and sense organs: Secondary | ICD-10-CM | POA: Insufficient documentation

## 2013-10-08 DIAGNOSIS — E785 Hyperlipidemia, unspecified: Secondary | ICD-10-CM | POA: Insufficient documentation

## 2013-10-08 DIAGNOSIS — Z79899 Other long term (current) drug therapy: Secondary | ICD-10-CM | POA: Insufficient documentation

## 2013-10-08 DIAGNOSIS — R739 Hyperglycemia, unspecified: Secondary | ICD-10-CM

## 2013-10-08 DIAGNOSIS — F17200 Nicotine dependence, unspecified, uncomplicated: Secondary | ICD-10-CM | POA: Insufficient documentation

## 2013-10-08 LAB — URINALYSIS, ROUTINE W REFLEX MICROSCOPIC
BILIRUBIN URINE: NEGATIVE
Glucose, UA: >1000 mg/dL — AB
HGB URINE DIPSTICK: NEGATIVE
KETONES UR: NEGATIVE mg/dL
Nitrite: NEGATIVE
Protein, ur: NEGATIVE mg/dL
SPECIFIC GRAVITY, URINE: 1.026 (ref 1.005–1.030)
UROBILINOGEN UA: 0.2 mg/dL (ref 0.0–1.0)
pH: 5 (ref 5.0–8.0)

## 2013-10-08 LAB — CBC WITH DIFFERENTIAL/PLATELET
Basophils Absolute: 0 10*3/uL (ref 0.0–0.1)
Basophils Relative: 0 % (ref 0–1)
EOS PCT: 2 % (ref 0–5)
Eosinophils Absolute: 0.2 10*3/uL (ref 0.0–0.7)
HEMATOCRIT: 40.4 % (ref 36.0–46.0)
HEMOGLOBIN: 13.4 g/dL (ref 12.0–15.0)
LYMPHS ABS: 4.1 10*3/uL — AB (ref 0.7–4.0)
LYMPHS PCT: 32 % (ref 12–46)
MCH: 26.9 pg (ref 26.0–34.0)
MCHC: 33.2 g/dL (ref 30.0–36.0)
MCV: 81 fL (ref 78.0–100.0)
MONO ABS: 0.4 10*3/uL (ref 0.1–1.0)
MONOS PCT: 3 % (ref 3–12)
NEUTROS ABS: 7.8 10*3/uL — AB (ref 1.7–7.7)
Neutrophils Relative %: 63 % (ref 43–77)
Platelets: 354 10*3/uL (ref 150–400)
RBC: 4.99 MIL/uL (ref 3.87–5.11)
RDW: 12.9 % (ref 11.5–15.5)
WBC: 12.6 10*3/uL — AB (ref 4.0–10.5)

## 2013-10-08 LAB — COMPREHENSIVE METABOLIC PANEL
ALT: 9 U/L (ref 0–35)
AST: 12 U/L (ref 0–37)
Albumin: 4.1 g/dL (ref 3.5–5.2)
Alkaline Phosphatase: 130 U/L — ABNORMAL HIGH (ref 39–117)
BILIRUBIN TOTAL: 0.7 mg/dL (ref 0.3–1.2)
BUN: 19 mg/dL (ref 6–23)
CHLORIDE: 91 meq/L — AB (ref 96–112)
CO2: 23 meq/L (ref 19–32)
CREATININE: 0.99 mg/dL (ref 0.50–1.10)
Calcium: 10.4 mg/dL (ref 8.4–10.5)
GFR, EST AFRICAN AMERICAN: 74 mL/min — AB (ref >90)
GFR, EST NON AFRICAN AMERICAN: 64 mL/min — AB (ref >90)
GLUCOSE: 335 mg/dL — AB (ref 70–99)
Potassium: 3.8 mEq/L (ref 3.7–5.3)
Sodium: 132 mEq/L — ABNORMAL LOW (ref 137–147)
Total Protein: 8.7 g/dL — ABNORMAL HIGH (ref 6.0–8.3)

## 2013-10-08 LAB — URINE MICROSCOPIC-ADD ON

## 2013-10-08 LAB — CBG MONITORING, ED
GLUCOSE-CAPILLARY: 369 mg/dL — AB (ref 70–99)
GLUCOSE-CAPILLARY: 427 mg/dL — AB (ref 70–99)

## 2013-10-08 NOTE — ED Notes (Signed)
Pt presents to department for evaluation of hyperglycemia. Pt states CBG over 500 at home today. States she feels foggy and not like herself. Denies pain. Pt is alert and oriented x4.

## 2013-10-08 NOTE — ED Provider Notes (Signed)
CSN: [id number]     Arrival date & time 10/08/13  1725 History   First MD Initiated Contact with Patient 10/08/13 2144     Chief Complaint  Patient presents with  . Hyperglycemia     (Consider location/radiation/quality/duration/timing/severity/associated sxs/prior Treatment) HPI Patient presents with concerns of hyperglycemia. Patient states that she has felt generally anxious, stressed in the past 2 days, as she has been caring for an ill family member. Today, on multiple home glucose checks she had a substantially elevated glucose. She currently denies any nausea, vomiting, chest pain, belly pain, lightheadedness, syncope or other focal changes from her baseline condition.  Past Medical History  Diagnosis Date  . Hypertension   . Diabetes mellitus   . Hyperlipidemia   . Peripheral neuropathy    History reviewed. No pertinent past surgical history. No family history on file. History  Substance Use Topics  . Smoking status: Current Every Day Smoker    Types: Cigarettes  . Smokeless tobacco: Not on file  . Alcohol Use: No   OB History   Grav Para Term Preterm Abortions TAB SAB Ect Mult Living                 Review of Systems  Constitutional:       Per HPI, otherwise negative  HENT:       Per HPI, otherwise negative  Respiratory:       Per HPI, otherwise negative  Cardiovascular:       Per HPI, otherwise negative  Gastrointestinal: Negative for vomiting.  Endocrine:       Negative aside from HPI  Genitourinary:       Neg aside from HPI   Musculoskeletal:       Per HPI, otherwise negative  Skin: Negative.   Neurological: Negative for syncope.      Allergies  Lisinopril and Morphine and related  Home Medications   Prior to Admission medications   Medication Sig Start Date End Date Taking? Authorizing Provider  amLODipine (NORVASC) 10 MG tablet Take 10 mg by mouth daily.   Yes Historical Provider, MD  cloNIDine (CATAPRES) 0.2 MG tablet Take 0.2 mg by  mouth at bedtime. For night sweats   Yes Historical Provider, MD  cyanocobalamin 100 MCG tablet Take 100 mcg by mouth daily.   Yes Historical Provider, MD  folic acid (FOLVITE) 1 MG tablet Take 1 mg by mouth daily.   Yes Historical Provider, MD  gabapentin (NEURONTIN) 300 MG capsule Take 300 mg by mouth at bedtime.   Yes Historical Provider, MD  glimepiride (AMARYL) 4 MG tablet Take 4 mg by mouth daily before breakfast.   Yes Historical Provider, MD  hydrOXYzine (ATARAX/VISTARIL) 25 MG tablet Take 25 mg by mouth at bedtime.   Yes Historical Provider, MD  insulin aspart (NOVOLOG FLEXPEN) 100 UNIT/ML FlexPen Inject 2-30 Units into the skin daily as needed for high blood sugar.   Yes Historical Provider, MD  loratadine (CLARITIN) 10 MG tablet Take 10 mg by mouth daily.   Yes Historical Provider, MD  losartan-hydrochlorothiazide (HYZAAR) 50-12.5 MG per tablet Take 1 tablet by mouth daily.   Yes Historical Provider, MD  metFORMIN (GLUCOPHAGE) 500 MG tablet Take 500 mg by mouth 2 (two) times daily with a meal.    Yes Historical Provider, MD   BP 123/88  Pulse 87  Temp(Src) 98.1 F (36.7 C) (Oral)  Resp 13  SpO2 100% Physical Exam  Nursing note and vitals reviewed. Constitutional: She is oriented to  person, place, and time. She appears well-developed and well-nourished. No distress.  HENT:  Head: Normocephalic and atraumatic.  Eyes: Conjunctivae and EOM are normal.  Cardiovascular: Normal rate and regular rhythm.   Pulmonary/Chest: Effort normal and breath sounds normal. No stridor. No respiratory distress.  Abdominal: She exhibits no distension.  Musculoskeletal: She exhibits no edema.  Neurological: She is alert and oriented to person, place, and time. No cranial nerve deficit.  Skin: Skin is warm and dry.  Psychiatric: She has a normal mood and affect.    ED Course  Procedures (including critical care time) Labs Review Labs Reviewed  CBC WITH DIFFERENTIAL - Abnormal; Notable for the  following:    WBC 12.6 (*)    Neutro Abs 7.8 (*)    Lymphs Abs 4.1 (*)    All other components within normal limits  COMPREHENSIVE METABOLIC PANEL - Abnormal; Notable for the following:    Sodium 132 (*)    Chloride 91 (*)    Glucose, Bld 335 (*)    Total Protein 8.7 (*)    Alkaline Phosphatase 130 (*)    GFR calc non Af Amer 64 (*)    GFR calc Af Amer 74 (*)    All other components within normal limits  URINALYSIS, ROUTINE W REFLEX MICROSCOPIC - Abnormal; Notable for the following:    APPearance CLOUDY (*)    Glucose, UA >1000 (*)    Leukocytes, UA TRACE (*)    All other components within normal limits  URINE MICROSCOPIC-ADD ON - Abnormal; Notable for the following:    Squamous Epithelial / LPF FEW (*)    Bacteria, UA FEW (*)    All other components within normal limits  CBG MONITORING, ED - Abnormal; Notable for the following:    Glucose-Capillary 369 (*)    All other components within normal limits  CBG MONITORING, ED - Abnormal; Notable for the following:    Glucose-Capillary 427 (*)    All other components within normal limits  URINE CULTURE       MDM   Final diagnoses:  Hyperglycemia    Patient presents with concern of hyperglycemia.  Patient does have mild hyperglycemia, but no anion gap, no evidence of distress, no evidence of ongoing infection. After length conversation with the patient, and the provision of IV fluids, and monitoring, she remained hemodynamically stable, in no distress, and is appropriate for further evaluation, management as an outpatient.    Gerhard Munchobert Rayven Hendrickson, MD 10/08/13 [phone]

## 2013-10-08 NOTE — ED Notes (Signed)
CBG 369 

## 2013-10-08 NOTE — Discharge Instructions (Signed)
As discussed, your evaluation today has been largely reassuring.  But, it is important that you monitor your condition carefully, and do not hesitate to return to the ED if you develop new, or concerning changes in your condition. ° °Otherwise, please follow-up with your physician for appropriate ongoing care. ° ° ° °Hyperglycemia °Hyperglycemia occurs when the glucose (sugar) in your blood is too high. Hyperglycemia can happen for many reasons, but it most often happens to people who do not know they have diabetes or are not managing their diabetes properly.  °CAUSES  °Whether you have diabetes or not, there are other causes of hyperglycemia. Hyperglycemia can occur when you have diabetes, but it can also occur in other situations that you might not be as aware of, such as: °Diabetes °· If you have diabetes and are having problems controlling your blood glucose, hyperglycemia could occur because of some of the following reasons: °· Not following your meal plan. °· Not taking your diabetes medications or not taking it properly. °· Exercising less or doing less activity than you normally do. °· Being sick. °Pre-diabetes °· This cannot be ignored. Before people develop Type 2 diabetes, they almost always have "pre-diabetes." This is when your blood glucose levels are higher than normal, but not yet high enough to be diagnosed as diabetes. Research has shown that some long-term damage to the body, especially the heart and circulatory system, may already be occurring during pre-diabetes. If you take action to manage your blood glucose when you have pre-diabetes, you may delay or prevent Type 2 diabetes from developing. °Stress °· If you have diabetes, you may be "diet" controlled or on oral medications or insulin to control your diabetes. However, you may find that your blood glucose is higher than usual in the hospital whether you have diabetes or not. This is often referred to as "stress hyperglycemia." Stress can  elevate your blood glucose. This happens because of hormones put out by the body during times of stress. If stress has been the cause of your high blood glucose, it can be followed regularly by your caregiver. That way he/she can make sure your hyperglycemia does not continue to get worse or progress to diabetes. °Steroids °· Steroids are medications that act on the infection fighting system (immune system) to block inflammation or infection. One side effect can be a rise in blood glucose. Most people can produce enough extra insulin to allow for this rise, but for those who cannot, steroids make blood glucose levels go even higher. It is not unusual for steroid treatments to "uncover" diabetes that is developing. It is not always possible to determine if the hyperglycemia will go away after the steroids are stopped. A special blood test called an A1c is sometimes done to determine if your blood glucose was elevated before the steroids were started. °SYMPTOMS °· Thirsty. °· Frequent urination. °· Dry mouth. °· Blurred vision. °· Tired or fatigue. °· Weakness. °· Sleepy. °· Tingling in feet or leg. °DIAGNOSIS  °Diagnosis is made by monitoring blood glucose in one or all of the following ways: °· A1c test. This is a chemical found in your blood. °· Fingerstick blood glucose monitoring. °· Laboratory results. °TREATMENT  °First, knowing the cause of the hyperglycemia is important before the hyperglycemia can be treated. Treatment may include, but is not be limited to: °· Education. °· Change or adjustment in medications. °· Change or adjustment in meal plan. °· Treatment for an illness, infection, etc. °· More frequent   blood glucose monitoring. °· Change in exercise plan. °· Decreasing or stopping steroids. °· Lifestyle changes. °HOME CARE INSTRUCTIONS  °· Test your blood glucose as directed. °· Exercise regularly. Your caregiver will give you instructions about exercise. Pre-diabetes or diabetes which comes on with  stress is helped by exercising. °· Eat wholesome, balanced meals. Eat often and at regular, fixed times. Your caregiver or nutritionist will give you a meal plan to guide your sugar intake. °· Being at an ideal weight is important. If needed, losing as little as 10 to 15 pounds may help improve blood glucose levels. °SEEK MEDICAL CARE IF:  °· You have questions about medicine, activity, or diet. °· You continue to have symptoms (problems such as increased thirst, urination, or weight gain). °SEEK IMMEDIATE MEDICAL CARE IF:  °· You are vomiting or have diarrhea. °· Your breath smells fruity. °· You are breathing faster or slower. °· You are very sleepy or incoherent. °· You have numbness, tingling, or pain in your feet or hands. °· You have chest pain. °· Your symptoms get worse even though you have been following your caregiver's orders. °· If you have any other questions or concerns. °Document Released: 10/11/2000 Document Revised: 07/10/2011 Document Reviewed: 08/14/2011 °[employer]® Patient Information ©2014 [employer]. ° °

## 2013-10-10 LAB — URINE CULTURE

## 2014-02-20 IMAGING — CR DG CHEST 1V PORT
1 series · 1 of 1 positions shown · non-contrast
Comparison: 08/28/2008

CLINICAL DATA: Endotracheal tube placement.

PORTABLE CHEST - 1 VIEW

[view not recorded]
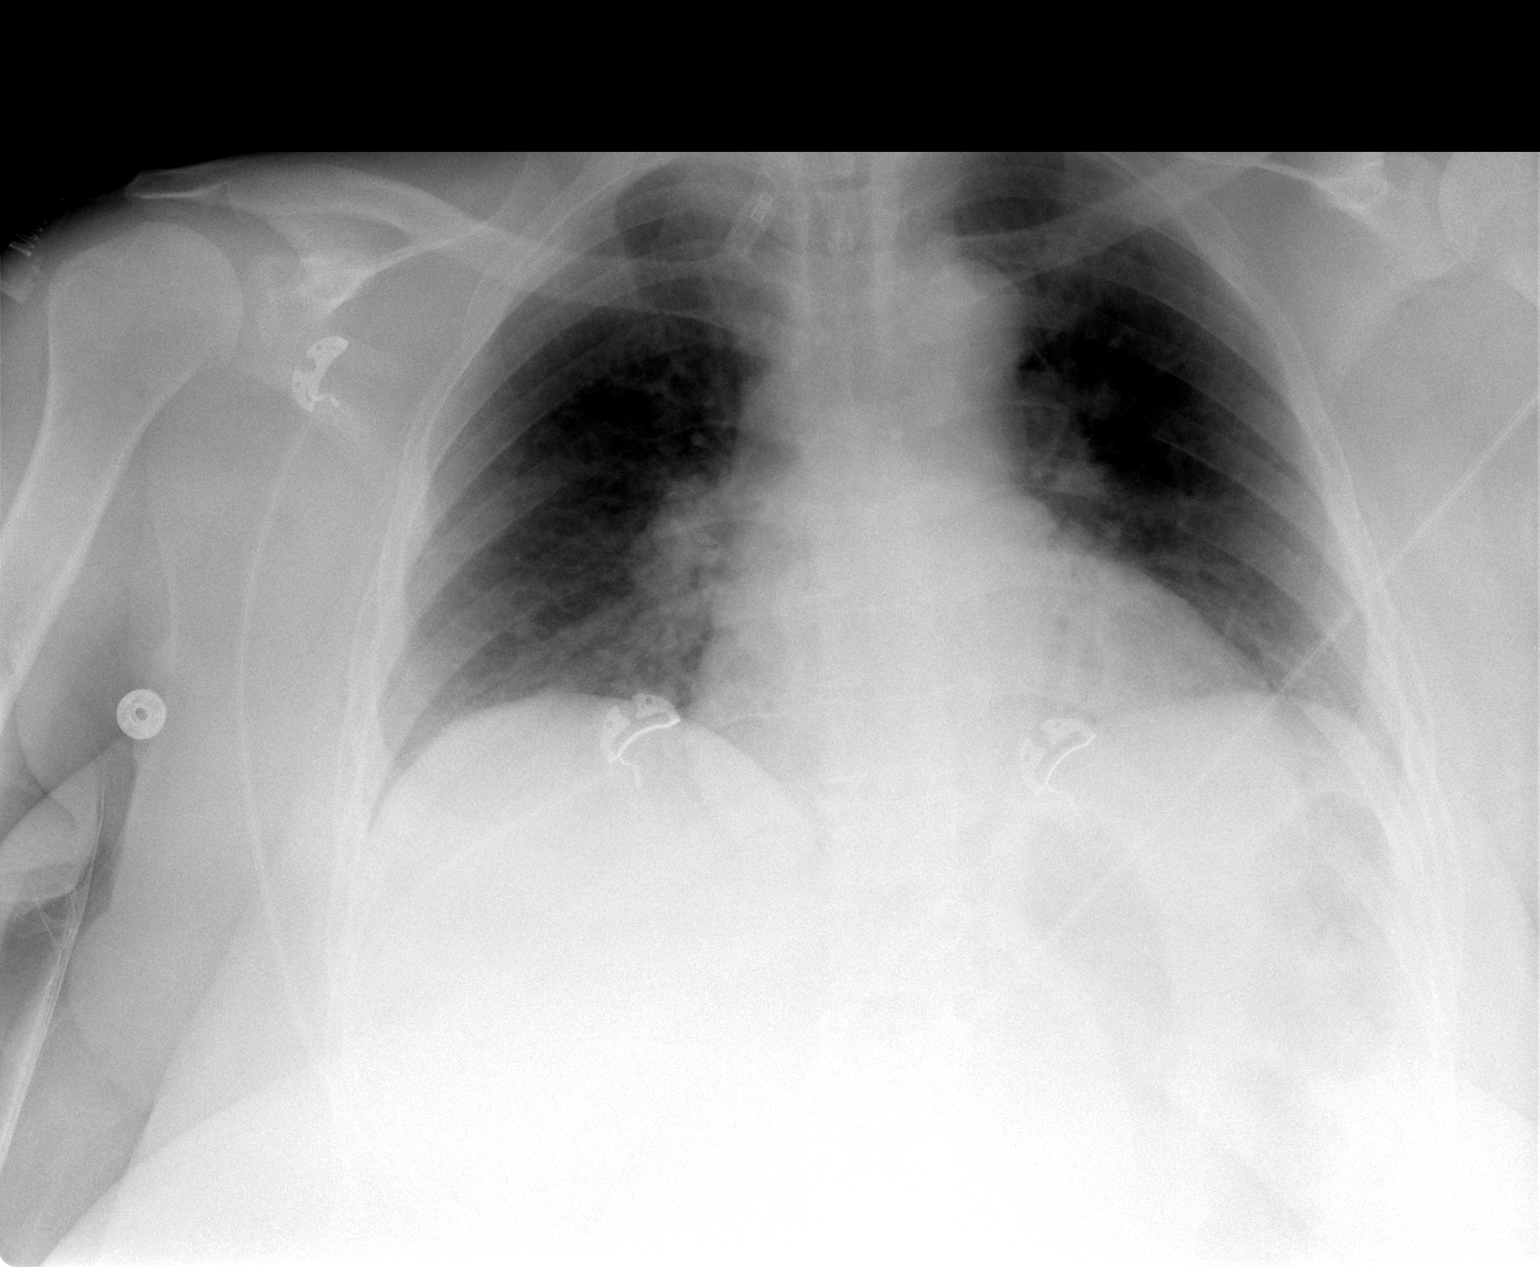

[1 of 1 positions shown; findings below may reference images not displayed]

FINDINGS: Endotracheal tube terminates  5.8 cm above carina, just
above the level of the clavicles.

Midline trachea.  Normal heart size.  No pleural effusion or
pneumothorax.  Low lung volumes with resultant pulmonary
interstitial prominence.  Mild bibasilar atelectasis.
IMPRESSION: 1.  Endotracheal tube borderline high in position.  Consider
advancement 1 - 2 cm. This study was made a "call report".
2.  Low lung volumes, without acute disease.

## 2021-05-24 ENCOUNTER — Ambulatory Visit: Payer: Self-pay | Admitting: Internal Medicine

## 2022-04-08 ENCOUNTER — Emergency Department (HOSPITAL_COMMUNITY)
Admission: EM | Admit: 2022-04-08 | Discharge: 2022-05-01 | Disposition: E | Payer: Medicare HMO | Attending: Emergency Medicine | Admitting: Emergency Medicine

## 2022-04-08 DIAGNOSIS — E119 Type 2 diabetes mellitus without complications: Secondary | ICD-10-CM | POA: Insufficient documentation

## 2022-04-08 DIAGNOSIS — Z7984 Long term (current) use of oral hypoglycemic drugs: Secondary | ICD-10-CM | POA: Insufficient documentation

## 2022-04-08 DIAGNOSIS — Z794 Long term (current) use of insulin: Secondary | ICD-10-CM | POA: Insufficient documentation

## 2022-04-08 DIAGNOSIS — I469 Cardiac arrest, cause unspecified: Secondary | ICD-10-CM | POA: Insufficient documentation

## 2022-04-08 LAB — I-STAT CHEM 8, ED
BUN: 32 mg/dL — ABNORMAL HIGH (ref 8–23)
Calcium, Ion: 1.12 mmol/L — ABNORMAL LOW (ref 1.15–1.40)
Chloride: 107 mmol/L (ref 98–111)
Creatinine, Ser: 1.7 mg/dL — ABNORMAL HIGH (ref 0.44–1.00)
Glucose, Bld: 538 mg/dL (ref 70–99)
HCT: 36 % (ref 36.0–46.0)
Hemoglobin: 12.2 g/dL (ref 12.0–15.0)
Potassium: 5 mmol/L (ref 3.5–5.1)
Sodium: 133 mmol/L — ABNORMAL LOW (ref 135–145)
TCO2: 10 mmol/L — ABNORMAL LOW (ref 22–32)

## 2022-04-08 LAB — I-STAT VENOUS BLOOD GAS, ED
Acid-base deficit: 30 mmol/L — ABNORMAL HIGH (ref 0.0–2.0)
Bicarbonate: 5.1 mmol/L — ABNORMAL LOW (ref 20.0–28.0)
Calcium, Ion: 1.12 mmol/L — ABNORMAL LOW (ref 1.15–1.40)
HCT: 36 % (ref 36.0–46.0)
Hemoglobin: 12.2 g/dL (ref 12.0–15.0)
O2 Saturation: 51 %
Potassium: 5 mmol/L (ref 3.5–5.1)
Sodium: 133 mmol/L — ABNORMAL LOW (ref 135–145)
TCO2: 7 mmol/L — ABNORMAL LOW (ref 22–32)
pCO2, Ven: 56.5 mmHg (ref 44–60)
pH, Ven: 6.56 — CL (ref 7.25–7.43)
pO2, Ven: 65 mmHg — ABNORMAL HIGH (ref 32–45)

## 2022-05-01 NOTE — ED Notes (Addendum)
IO removed from left tib. King airway removed.

## 2022-05-01 NOTE — Progress Notes (Signed)
   May 08, 2022 0930  Clinical Encounter Type  Visited With Patient and family together;Health care provider  Visit Type Initial;ED;Death   Chaplain responded to patient's death. Chaplain met with patient's husband Elaina Pattee [phone]; [address], [place] [address]) and son. Chaplain extended hospitality. Chaplain gave patient information about Patient Placement Dept. Spiritual care services available as needed.   Jeri Lager, Chaplain 2022-05-08

## 2022-05-01 NOTE — ED Provider Notes (Signed)
[facility] EMERGENCY DEPARTMENT Provider Note   CSN: [id number] Arrival date & time: 2022/04/28  0751     History  Chief Complaint  Patient presents with   Cardiac Arrest    Lynn Mayer is a 62 y.o. female.  HPI 62 year old female with a history of diabetes presents in cardiac arrest.  History is obtained from the paramedics.  About 30 minutes prior to the 911 call, the husband had seen the patient but she was too short of breath and weak to go to the bathroom.  She has been dealing with URI symptoms for 3 to 4 days.  When he went to check on her she was pulseless and apneic.  EMS performed CPR and has been doing CPR on and off for over an hour.  Initially seemed PEA and at one point seemed like she might have ventricular tachycardia and was shocked.  Otherwise has been PEA up until the most recent check and was asystole.  This most recent round of CPR has been going on for about 7 or 8 minutes.  She has received 10 epinephrines.  Home Medications Prior to Admission medications   Medication Sig Start Date End Date Taking? Authorizing Provider  amLODipine (NORVASC) 10 MG tablet Take 10 mg by mouth daily.    [provider]  cloNIDine (CATAPRES) 0.2 MG tablet Take 0.2 mg by mouth at bedtime. For night sweats    [provider]  cyanocobalamin 100 MCG tablet Take 100 mcg by mouth daily.    [provider]  folic acid (FOLVITE) 1 MG tablet Take 1 mg by mouth daily.    [provider]  gabapentin (NEURONTIN) 300 MG capsule Take 300 mg by mouth at bedtime.    [provider]  glimepiride (AMARYL) 4 MG tablet Take 4 mg by mouth daily before breakfast.    [provider]  hydrOXYzine (ATARAX/VISTARIL) 25 MG tablet Take 25 mg by mouth at bedtime.    [provider]  insulin aspart (NOVOLOG FLEXPEN) 100 UNIT/ML FlexPen Inject 2-30 Units into the skin daily as needed for high blood sugar.    [provider]  loratadine (CLARITIN) 10 MG tablet Take 10 mg by mouth daily.    [provider]  losartan-hydrochlorothiazide (HYZAAR) 50-12.5 MG per tablet Take 1 tablet by mouth daily.    [provider]  metFORMIN (GLUCOPHAGE) 500 MG tablet Take 500 mg by mouth 2 (two) times daily with a meal.     [provider]      Allergies    Lisinopril and Morphine and related    Review of Systems   Review of Systems  Unable to perform ROS: Patient unresponsive    Physical Exam Updated Vital Signs There were no vitals taken for this visit. Physical Exam Vitals and nursing note reviewed.  Constitutional:      Appearance: She is well-developed.  HENT:     Head: Normocephalic and atraumatic.  Eyes:     Comments: Pupils fixed and dilated  Cardiovascular:     Pulses:          Femoral pulses are 0 on the right side and 0 on the left side. Pulmonary:     Comments: Intubated with King Airway Abdominal:     General: There is no distension.  Skin:    General: Skin is dry.  Neurological:     Mental Status: She is unresponsive.     GCS: GCS eye  subscore is 1. GCS verbal subscore is 1. GCS motor subscore is 1.     ED Results / Procedures / Treatments   Labs (all labs ordered are listed, but only abnormal results are displayed) Labs Reviewed  I-STAT CHEM 8, ED - Abnormal; Notable for the following components:      Result Value   Sodium 133 (*)    BUN 32 (*)    Creatinine, Ser 1.70 (*)    Glucose, Bld 538 (*)    Calcium, Ion 1.12 (*)    TCO2 10 (*)    All other components within normal limits  I-STAT VENOUS BLOOD GAS, ED - Abnormal; Notable for the following components:   pH, Ven 6.560 (*)    pO2, Ven 65 (*)    Bicarbonate 5.1 (*)    TCO2 7 (*)    Acid-base deficit 30.0 (*)    Sodium 133 (*)    Calcium, Ion 1.12 (*)    All other components within normal limits    EKG EKG Interpretation  Date/Time:  Saturday 04/29/2022 08:01:30 EST Ventricular Rate:   0 PR Interval:    QRS Duration:   QT Interval:    QTC Calculation:   R Axis:   0 Text Interpretation: asystole Confirmed by Pricilla Loveless [phone]) on 04-29-2022 9:01:44 AM  Radiology No results found.  Procedures CPR  Date/Time: 04/29/22 8:08 AM  Performed by: Pricilla Loveless, MD Authorized by: Pricilla Loveless, MD  CPR Procedure Details:    ACLS/BLS initiated by EMS: Yes     CPR/ACLS performed in the ED: Yes     Outcome: Pt declared dead    CPR performed via ACLS guidelines under my direct supervision.  See RN documentation for details including defibrillator use, medications, doses and timing.     Medications Ordered in ED Medications - No data to display  ED Course/ Medical Decision Making/ A&P                           Medical Decision Making  Patient presents in cardiac arrest.  At 1 point during the arrest it seemed like she had PEA as 1 teammate was able to feel a very weak femoral pulse.  She was given atropine and CPR restarted.  At this time during a bedside ultrasound was also able to see some very weak and slow squeeze of her heart.  However on the next recheck she was asystole.  Given this with prolonged downtime I decided to stop the resuscitation.  She was pronounced deceased at 7:58 AM.  Of note, I discussed her unfortunate demise with her husband.  He notes that she seemed to have a URI, get better, then have worsening symptoms over the last few days.  Significant dyspnea today.  I suspect she became hypoxic which led to an arrest.  I discussed with Santiago Bumpers, medical examiner, no indication for ME case.        Final Clinical Impression(s) / ED Diagnoses Final diagnoses:  Cardiac arrest [facility])    Rx / DC Orders ED Discharge Orders     None         Pricilla Loveless, MD April 29, 2022 [phone]

## 2022-05-01 NOTE — ED Triage Notes (Signed)
5638 patient presents to ed via [facility] states Patient has been c/o cold flu like sx. Since wed. Husband last saw patient awake approx. 0550 when she was going to the bathroom , states she was very weak and "winded" when getting back in bed. 30 mins later found patient unresp not breathing ems was called. Upon ems arrival patient patient was pulseless and apneic CPR was started @0639  ems states patient was in and out of PEA and asystole  upon arrival was going, Patient was shocked x 1 per ems King airway in IO left tib. Patient was given 10 Epip 0753 pulse check no pulse lucas continued. Atropine 1 mg given .  0754 20 gleft AC 0757 pulse check no pulse Dr. Samuel Bouche with u/s no cardiac activity [phone] All ACLS efforts unsuccessful , cpr discontinued.

## 2022-05-01 NOTE — ED Notes (Signed)
Patients husband brought to bedside.

## 2022-05-01 DEATH — deceased
